# Patient Record
Sex: Female | Born: 1935 | Race: White | Hispanic: No | Smoking: Never smoker
Health system: Southern US, Community
[De-identification: ages and names within clinical notes are randomized; demographics above are authoritative.]

## PROBLEM LIST (undated history)

## (undated) DIAGNOSIS — K219 Gastro-esophageal reflux disease without esophagitis: Secondary | ICD-10-CM

## (undated) DIAGNOSIS — N200 Calculus of kidney: Secondary | ICD-10-CM

## (undated) DIAGNOSIS — I10 Essential (primary) hypertension: Secondary | ICD-10-CM

## (undated) DIAGNOSIS — I499 Cardiac arrhythmia, unspecified: Secondary | ICD-10-CM

## (undated) DIAGNOSIS — IMO0001 Reserved for inherently not codable concepts without codable children: Secondary | ICD-10-CM

## (undated) HISTORY — PX: OTHER SURGICAL HISTORY: SHX169

## (undated) HISTORY — PX: FOOT SURGERY: SHX648

## (undated) HISTORY — PX: BACK SURGERY: SHX140

## (undated) HISTORY — PX: ROTATOR CUFF REPAIR: SHX139

## (undated) HISTORY — PX: KNEE ARTHROSCOPY: SUR90

## (undated) HISTORY — PX: HAND SURGERY: SHX662

## (undated) HISTORY — PX: JOINT REPLACEMENT: SHX530

---

## 2011-09-23 ENCOUNTER — Emergency Department (HOSPITAL_BASED_OUTPATIENT_CLINIC_OR_DEPARTMENT_OTHER)
Admission: EM | Admit: 2011-09-23 | Discharge: 2011-09-23 | Disposition: A | Discharge status: Home / Self Care | Payer: Medicare Other | Attending: Emergency Medicine | Admitting: Emergency Medicine

## 2011-09-23 ENCOUNTER — Emergency Department (HOSPITAL_BASED_OUTPATIENT_CLINIC_OR_DEPARTMENT_OTHER): Payer: Medicare Other

## 2011-09-23 ENCOUNTER — Encounter (HOSPITAL_BASED_OUTPATIENT_CLINIC_OR_DEPARTMENT_OTHER): Payer: Self-pay | Admitting: *Deleted

## 2011-09-23 DIAGNOSIS — K219 Gastro-esophageal reflux disease without esophagitis: Secondary | ICD-10-CM | POA: Insufficient documentation

## 2011-09-23 DIAGNOSIS — N201 Calculus of ureter: Secondary | ICD-10-CM

## 2011-09-23 DIAGNOSIS — R109 Unspecified abdominal pain: Secondary | ICD-10-CM | POA: Insufficient documentation

## 2011-09-23 DIAGNOSIS — N1330 Unspecified hydronephrosis: Secondary | ICD-10-CM | POA: Insufficient documentation

## 2011-09-23 DIAGNOSIS — I10 Essential (primary) hypertension: Secondary | ICD-10-CM | POA: Insufficient documentation

## 2011-09-23 DIAGNOSIS — N200 Calculus of kidney: Secondary | ICD-10-CM | POA: Insufficient documentation

## 2011-09-23 DIAGNOSIS — N289 Disorder of kidney and ureter, unspecified: Secondary | ICD-10-CM

## 2011-09-23 DIAGNOSIS — Z79899 Other long term (current) drug therapy: Secondary | ICD-10-CM | POA: Insufficient documentation

## 2011-09-23 DIAGNOSIS — Z87442 Personal history of urinary calculi: Secondary | ICD-10-CM | POA: Insufficient documentation

## 2011-09-23 HISTORY — DX: Calculus of kidney: N20.0

## 2011-09-23 HISTORY — DX: Reserved for inherently not codable concepts without codable children: IMO0001

## 2011-09-23 HISTORY — DX: Essential (primary) hypertension: I10

## 2011-09-23 HISTORY — DX: Gastro-esophageal reflux disease without esophagitis: K21.9

## 2011-09-23 HISTORY — DX: Cardiac arrhythmia, unspecified: I49.9

## 2011-09-23 LAB — CBC
Hemoglobin: 13.7 g/dL (ref 12.0–15.0)
Platelets: 178 10*3/uL (ref 150–400)
RBC: 4.32 MIL/uL (ref 3.87–5.11)
WBC: 6.7 10*3/uL (ref 4.0–10.5)

## 2011-09-23 LAB — COMPREHENSIVE METABOLIC PANEL
ALT: 27 U/L (ref 0–35)
Alkaline Phosphatase: 75 U/L (ref 39–117)
BUN: 23 mg/dL (ref 6–23)
CO2: 26 mEq/L (ref 19–32)
Chloride: 99 mEq/L (ref 96–112)
GFR calc Af Amer: 36 mL/min — ABNORMAL LOW (ref >90)
GFR calc non Af Amer: 31 mL/min — ABNORMAL LOW (ref >90)
Glucose, Bld: 159 mg/dL — ABNORMAL HIGH (ref 70–99)
Potassium: 3.4 mEq/L — ABNORMAL LOW (ref 3.5–5.1)
Sodium: 136 mEq/L (ref 135–145)
Total Bilirubin: 0.9 mg/dL (ref 0.3–1.2)
Total Protein: 7.1 g/dL (ref 6.0–8.3)

## 2011-09-23 LAB — URINALYSIS, ROUTINE W REFLEX MICROSCOPIC
Bilirubin Urine: NEGATIVE
Hgb urine dipstick: NEGATIVE
Nitrite: NEGATIVE
Specific Gravity, Urine: 1.008 (ref 1.005–1.030)
Urobilinogen, UA: 0.2 mg/dL (ref 0.0–1.0)
pH: 5.5 (ref 5.0–8.0)

## 2011-09-23 LAB — DIFFERENTIAL
Lymphocytes Relative: 16 % (ref 12–46)
Lymphs Abs: 1.1 10*3/uL (ref 0.7–4.0)
Monocytes Relative: 13 % — ABNORMAL HIGH (ref 3–12)
Neutro Abs: 4.7 10*3/uL (ref 1.7–7.7)
Neutrophils Relative %: 70 % (ref 43–77)

## 2011-09-23 MED ORDER — HYDROMORPHONE HCL PF 1 MG/ML IJ SOLN
1.0000 mg | Freq: Once | INTRAMUSCULAR | Status: AC
  Administered 2011-09-23: 1 mg via INTRAVENOUS
  Filled 2011-09-23: qty 1

## 2011-09-23 MED ORDER — SODIUM CHLORIDE 0.9 % IV SOLN
Freq: Once | INTRAVENOUS | Status: AC
  Administered 2011-09-23: 09:00:00 via INTRAVENOUS

## 2011-09-23 MED ORDER — TAMSULOSIN HCL 0.4 MG PO CAPS: 0.4000 mg | ORAL_CAPSULE | Freq: Every day | ORAL | Status: DC

## 2011-09-23 MED ORDER — ONDANSETRON HCL 4 MG/2ML IJ SOLN
4.0000 mg | Freq: Once | INTRAMUSCULAR | Status: AC
  Administered 2011-09-23: 4 mg via INTRAVENOUS
  Filled 2011-09-23: qty 2

## 2011-09-23 MED ORDER — HYDROMORPHONE HCL 2 MG PO TABS: 2.0000 mg | ORAL_TABLET | ORAL | Status: AC | PRN

## 2011-09-23 MED ORDER — METOCLOPRAMIDE HCL 10 MG PO TABS: 10.0000 mg | ORAL_TABLET | Freq: Four times a day (QID) | ORAL | Status: DC

## 2011-09-23 NOTE — ED Notes (Signed)
Patient states 2 days ago she developed nausea and malaise which woke her up from sleep at 0330 am.  Symptoms were associated with dry heaves.  States she normally has up to 4 BM's per day, has only had one very small bm over the last 2 days.  Developed left flank pain several hours later, history of kidney stones.  States pain and nausea have progressively worsened.

## 2011-09-23 NOTE — Discharge Instructions (Signed)
Return to the emergency department if pain is not being adequately controlled at home, or if you start running a fever. You do have a large stone in your kidney in addition to the one in your ureter. Please followup with either your urologist, or Dr. Laverle Patter, to see if you need any treatment for that.  Ureteral Colic Ureteral colic is spasm-like pain from the kidney or the ureter. This is often caused by a kidney stone. The pain is caused by the stone trying to get through the tubes that pass your pee. HOME CARE   Drink enough fluids to keep your pee (urine) clear or pale yellow.   Strain all your pee. A strainer will be provided. Keep anything caught in the strainer and bring it to your doctor. The stone causing the pain may be very small.   Only take medicine as told by your doctor.   Follow up with your doctor as told.  GET HELP RIGHT AWAY IF:   Pain is not controlled with medicine.   Pain continues or gets worse.   The pain changes and there is chest or belly (abdominal) pain.   You pass out (faint).   You cannot pee.   You keep throwing up (vomiting).   You have a temperature by mouth above 102 F (38.9 C), not controlled by medicine.  MAKE SURE YOU:   Understand these instructions.   Will watch this condition.   Will get help right away if you are not doing well or get worse.  Document Released: 09/14/2007 Document Revised: 03/17/2011 Document Reviewed: 09/14/2007 [facility] Patient Information 2012 [place], [place].  Hydromorphone tablets What is this medicine? HYDROMORPHONE (hye droe MOR fone) is a pain reliever. It is used to treat moderate to severe pain. This medicine may be used for other purposes; ask your health care provider or pharmacist if you have questions. What should I tell my health care provider before I take this medicine? They need to know if you have any of these conditions: -brain tumor -drug abuse or addiction -head injury -heart  disease -frequently drink alcohol containing drinks -kidney disease or problems going to the bathroom -liver disease -lung disease, asthma, or breathing problems -mental problems -an allergic or unusual reaction to lactose, hydromorphone, other opioid analgesics, other medicines, sulfites, foods, dyes, or preservatives -pregnant or trying to get pregnant -breast-feeding How should I use this medicine? Take this medicine by mouth with a glass of water. If the medicine upsets your stomach, take it with food or milk. Follow the directions on the prescription label. Do not take more medicine than you are told to take. Talk to your pediatrician regarding the use of this medicine in children. Special care may be needed. Overdosage: If you think you have taken too much of this medicine contact a poison control center or emergency room at once. NOTE: This medicine is only for you. Do not share this medicine with others. What if I miss a dose? If you miss a dose, take it as soon as you can. If it is almost time for your next dose, take only that dose. Do not take double or extra doses. What may interact with this medicine? -alcohol -antihistamines for allergy, cough and cold -medicines for anesthesia -medicines for depression, anxiety, or psychotic disturbances -medicines for sleep -muscle relaxants -naltrexone -narcotic medicines for pain -phenothiazines like chlorpromazine, mesoridazine, prochlorperazine, thioridazine This list may not describe all possible interactions. Give your health care provider a list of all the medicines,  herbs, non-prescription drugs, or dietary supplements you use. Also tell them if you smoke, drink alcohol, or use illegal drugs. Some items may interact with your medicine. What should I watch for while using this medicine? Tell your doctor or health care professional if your pain does not go away, if it gets worse, or if you have new or a different type of pain. You  may develop tolerance to the medicine. Tolerance means that you will need a higher dose of the medicine for pain relief. Tolerance is normal and is expected if you take this medicine for a long time. Do not suddenly stop taking your medicine because you may develop a severe reaction. Your body becomes used to the medicine. This does NOT mean you are addicted. Addiction is a behavior related to getting and using a drug for a non-medical reason. If you have pain, you have a medical reason to take pain medicine. Your doctor will tell you how much medicine to take. If your doctor wants you to stop the medicine, the dose will be slowly lowered over time to avoid any side effects. You may get drowsy or dizzy. Do not drive, use machinery, or do anything that needs mental alertness until you know how this medicine affects you. Do not stand or sit up quickly, especially if you are an older patient. This reduces the risk of dizzy or fainting spells. Alcohol may interfere with the effect of this medicine. Avoid alcoholic drinks. This medicine will cause constipation. Try to have a bowel movement at least every 2 to 3 days. If you do not have a bowel movement for 3 days, call your doctor or health care professional. Your mouth may get dry. Chewing sugarless gum or sucking hard candy, and drinking plenty of water may help. Contact your doctor if the problem does not go away or is severe. What side effects may I notice from receiving this medicine? Side effects that you should report to your doctor or health care professional as soon as possible: -allergic reactions like skin rash, itching or hives, swelling of the face, lips, or tongue -breathing problems -changes in vision -confusion -feeling faint or lightheaded, falls -seizures -slow or fast heartbeat -trouble passing urine or change in the amount of urine -trouble with balance, talking, walking Side effects that usually do not require medical attention  (report to your doctor or health care professional if they continue or are bothersome): -difficulty sleeping -drowsiness -dry mouth -flushing -headache -itching -loss of appetite -nausea, vomiting This list may not describe all possible side effects. Call your doctor for medical advice about side effects. You may report side effects to FDA at 1-800-FDA-1088. Where should I keep my medicine? Keep out of the reach of children. This medicine can be abused. Keep your medicine in a safe place to protect it from theft. Do not share this medicine with anyone. Selling or giving away this medicine is dangerous and against the law. Store at room temperature between 15 and 30 degrees C (59 and 86 degrees F). Keep container tightly closed. Protect from light. Flush any unused medicines down the toilet. Do not use the medicine after the expiration date. NOTE: This sheet is a summary. It may not cover all possible information. If you have questions about this medicine, talk to your doctor, pharmacist, or health care provider.  2012, [employer]. (02/22/2008 10:24:26 AM)  Tamsulosin capsules What is this medicine? TAMSULOSIN (tam SOO loe sin) is used to treat enlargement of the prostate  gland in men, a condition called benign prostatic hyperplasia or BPH. It is not for use in women. It works by relaxing muscles in the prostate and bladder neck. This improves urine flow and reduces BPH symptoms. This medicine may be used for other purposes; ask your health care provider or pharmacist if you have questions. What should I tell my health care provider before I take this medicine? They need to know if you have any of the following conditions: -advanced kidney disease -advanced liver disease -low blood pressure -prostate cancer -an unusual or allergic reaction to tamsulosin, sulfa drugs, other medicines, foods, dyes, or preservatives -pregnant or trying to get pregnant -breast-feeding How should  I use this medicine? Take this medicine by mouth about 30 minutes after the same meal every day. Follow the directions on the prescription label. Swallow the capsules whole with a glass of water. Do not crush, chew, or open capsules. Do not take your medicine more often than directed. Do not stop taking your medicine unless your doctor tells you to. Talk to your pediatrician regarding the use of this medicine in children. Special care may be needed. Overdosage: If you think you have taken too much of this medicine contact a poison control center or emergency room at once. NOTE: This medicine is only for you. Do not share this medicine with others. What if I miss a dose? If you miss a dose, take it as soon as you can. If it is almost time for your next dose, take only that dose. Do not take double or extra doses. If you stop taking your medicine for several days or more, ask your doctor or health care professional what dose you should start back on. What may interact with this medicine? -cimetidine -fluoxetine -ketoconazole -medicines for erectile disfunction like sildenafil, tadalafil, vardenafil -medicines for high blood pressure -other alpha-blockers like alfuzosin, doxazosin, phentolamine, phenoxybenzamine, prazosin, terazosin -warfarin This list may not describe all possible interactions. Give your health care provider a list of all the medicines, herbs, non-prescription drugs, or dietary supplements you use. Also tell them if you smoke, drink alcohol, or use illegal drugs. Some items may interact with your medicine. What should I watch for while using this medicine? Visit your doctor or health care professional for regular check ups. You will need lab work done before you start this medicine and regularly while you are taking it. Check your blood pressure as directed. Ask your health care professional what your blood pressure should be, and when you should contact him or her. This medicine may  make you feel dizzy or lightheaded. This is more likely to happen after the first dose, after an increase in dose, or during hot weather or exercise. Drinking alcohol and taking some medicines can make this worse. Do not drive, use machinery, or do anything that needs mental alertness until you know how this medicine affects you. Do not sit or stand up quickly. If you begin to feel dizzy, sit down until you feel better. These effects can decrease once your body adjusts to the medicine. Although extremely rare in men taking this medicine, contact you doctor immediately if you have a prolonged and painful erection of the penis which is unrelated to sexual activity. If you do not get medical attention, this condition can lead to permanent erectile dysfunction. If you are thinking of having cataract surgery, tell your eye surgeon that you have taken this medicine. What side effects may I notice from receiving this medicine? Side  effects that you should report to your doctor or health care professional as soon as possible: -allergic reactions like skin rash or itching, hives, swelling of the lips, mouth, tongue, or throat -breathing problems -change in vision -feeling faint or lightheaded -irregular heartbeat -weakness Side effects that usually do not require medical attention (report to your doctor or health care professional if they continue or are bothersome): -back pain -change in sex drive or performance -constipation, nausea or vomiting -cough -drowsy -runny or stuffy nose -trouble sleeping This list may not describe all possible side effects. Call your doctor for medical advice about side effects. You may report side effects to FDA at 1-800-FDA-1088. Where should I keep my medicine? Keep out of the reach of children. Store at room temperature between 15 and 30 degrees C (59 and 86 degrees F). Throw away any unused medicine after the expiration date. NOTE: This sheet is a summary. It may not  cover all possible information. If you have questions about this medicine, talk to your doctor, pharmacist, or health care provider.  2012, [employer]. (06/04/2007 6:08:27 PM)  Metoclopramide tablets What is this medicine? METOCLOPRAMIDE (met oh kloe PRA mide) is used to treat the symptoms of gastroesophageal reflux disease (GERD) like heartburn. It is also used to treat people with slow emptying of the stomach and intestinal tract. This medicine may be used for other purposes; ask your health care provider or pharmacist if you have questions. What should I tell my health care provider before I take this medicine? They need to know if you have any of these conditions: -breast cancer -depression -diabetes -heart failure -high blood pressure -kidney disease -liver disease -Parkinson's disease or a movement disorder -pheochromocytoma -seizures -stomach obstruction, bleeding, or perforation -an unusual or allergic reaction to metoclopramide, procainamide, sulfites, other medicines, foods, dyes, or preservatives -pregnant or trying to get pregnant -breast-feeding How should I use this medicine? Take this medicine by mouth with a glass of water. Follow the directions on the prescription label. Take this medicine on an empty stomach, about 30 minutes before eating. Take your doses at regular intervals. Do not take your medicine more often than directed. Do not stop taking except on the advice of your doctor or health care professional. A special MedGuide will be given to you by the pharmacist with each prescription and refill. Be sure to read this information carefully each time. Talk to your pediatrician regarding the use of this medicine in children. Special care may be needed. Overdosage: If you think you have taken too much of this medicine contact a poison control center or emergency room at once. NOTE: This medicine is only for you. Do not share this medicine with others. What  if I miss a dose? If you miss a dose, take it as soon as you can. If it is almost time for your next dose, take only that dose. Do not take double or extra doses. What may interact with this medicine? -acetaminophen -cyclosporine -digoxin -medicines for blood pressure -medicines for diabetes, including insulin -medicines for hay fever and other allergies -medicines for depression, especially an Monoamine Oxidase Inhibitor (MAOI) -medicines for Parkinson's disease, like levodopa -medicines for sleep or for pain -tetracycline This list may not describe all possible interactions. Give your health care provider a list of all the medicines, herbs, non-prescription drugs, or dietary supplements you use. Also tell them if you smoke, drink alcohol, or use illegal drugs. Some items may interact with your medicine. What should I watch  for while using this medicine? It may take a few weeks for your stomach condition to start to get better. However, do not take this medicine for longer than 12 weeks. The longer you take this medicine, and the more you take it, the greater your chances are of developing serious side effects. If you are an elderly patient, a female patient, or you have diabetes, you may be at an increased risk for side effects from this medicine. Contact your doctor immediately if you start having movements you cannot control such as lip smacking, rapid movements of the tongue, involuntary or uncontrollable movements of the eyes, head, arms and legs, or muscle twitches and spasms. Patients and their families should watch out for worsening depression or thoughts of suicide. Also watch out for any sudden or severe changes in feelings such as feeling anxious, agitated, panicky, irritable, hostile, aggressive, impulsive, severely restless, overly excited and hyperactive, or not being able to sleep. If this happens, especially at the beginning of treatment or after a change in dose, call your  doctor. Do not treat yourself for high fever. Ask your doctor or health care professional for advice. You may get drowsy or dizzy. Do not drive, use machinery, or do anything that needs mental alertness until you know how this drug affects you. Do not stand or sit up quickly, especially if you are an older patient. This reduces the risk of dizzy or fainting spells. Alcohol can make you more drowsy and dizzy. Avoid alcoholic drinks. What side effects may I notice from receiving this medicine? Side effects that you should report to your doctor or health care professional as soon as possible: -allergic reactions like skin rash, itching or hives, swelling of the face, lips, or tongue -abnormal production of milk in females -breast enlargement in both males and females -change in the way you walk -difficulty moving, speaking or swallowing -drooling, lip smacking, or rapid movements of the tongue -excessive sweating -fever -involuntary or uncontrollable movements of the eyes, head, arms and legs -irregular heartbeat or palpitations -muscle twitches and spasms -unusually weak or tired Side effects that usually do not require medical attention (report to your doctor or health care professional if they continue or are bothersome): -change in sex drive or performance -depressed mood -diarrhea -difficulty sleeping -headache -menstrual changes -restless or nervous This list may not describe all possible side effects. Call your doctor for medical advice about side effects. You may report side effects to FDA at 1-800-FDA-1088. Where should I keep my medicine? Keep out of the reach of children. Store at room temperature between 20 and 25 degrees C (68 and 77 degrees F). Protect from light. Keep container tightly closed. Throw away any unused medicine after the expiration date. NOTE: This sheet is a summary. It may not cover all possible information. If you have questions about this medicine, talk to  your doctor, pharmacist, or health care provider.  2012, [employer]. (11/21/2007 4:30:05 PM)

## 2011-09-23 NOTE — ED Provider Notes (Signed)
History     CSN: [id number]  Arrival date & time 09/23/11  0800   First MD Initiated Contact with Patient 09/23/11 [phone]      Chief Complaint  Patient presents with  . Abdominal Pain    (Consider location/radiation/quality/duration/timing/severity/associated sxs/prior treatment) HPI 77 year old female has had left flank pain for the last 2 days. Pain radiates to the left lower abdomen. It is a dull, achy pain which has been getting more severe. She currently rates the pain at 8/10. Nothing makes it better but it is worse when she lays on her back. There is been associated nausea and dry heaves but no vomiting. She also is noted that she feels bloated but is unable to pass flatus. She is also had a virtually no bowel movements during this time instead of her usual 3, but today. She denies history of previous abdominal surgery. She does have a history of kidney stones with lithotripsy in 2001. Her symptoms started with nausea and dry heaves and the flank pain followed up. She states that she thought she initially had food poisoning. She did feel hot all over but does not know if she ran a fever. Past Medical History  Diagnosis Date  . Kidney stone   . Hypertension   . Irregular heart beat   . Reflux     Past Surgical History  Procedure Date  . Back surgery   . Joint replacement   . Foot surgery     bilateral feet , 3 surgeries each  . Rotator cuff repair     left  . Knee arthroscopy   . Hand surgery     left    No family history on file.  History  Substance Use Topics  . Smoking status: Never Smoker   . Smokeless tobacco: Not on file  . Alcohol Use: No    OB History    Grav Para Term Preterm Abortions TAB SAB Ect Mult Living                  Review of Systems  Allergies  Tramadol; Aspirin; Codeine; Erythromycin; Flexeril; and Stinging nettle  Home Medications   Current Outpatient Rx  Name Route Sig Dispense Refill  . VITAMIN D 1000 UNITS PO TABS Oral Take  2,000 Units by mouth daily.    . OMEGA-3 FATTY ACIDS 1000 MG PO CAPS Oral Take 2 g by mouth daily.    Marland Kitchen FLAXSEED OIL PO Oral Take 1,000 mg by mouth.    Marland Kitchen GLUCOSAMINE-CHONDROITIN 500-400 MG PO TABS Oral Take 1 tablet by mouth 3 (three) times daily.    Marland Kitchen GREEN TEA 315 MG PO CAPS Oral Take by mouth 2 (two) times daily.    . MELOXICAM 15 MG PO TABS Oral Take 15 mg by mouth daily.    Marland Kitchen METOPROLOL SUCCINATE ER 25 MG PO TB24 Oral Take 25 mg by mouth daily.    . MULTIVITAMINS PO CAPS Oral Take 1 capsule by mouth daily.    Marland Kitchen OMEPRAZOLE 20 MG PO CPDR Oral Take 20 mg by mouth daily.      BP 181/67  Pulse 89  Temp 98.7 F (37.1 C) (Oral)  Resp 18  Ht 5\' 8"  (1.727 m)  Wt 260 lb (117.935 kg)  BMI 39.53 kg/m2  SpO2 100%  Physical Exam 77 year old female is obese and in no acute distress. Vital signs are significant for moderate systolic hypertension with blood pressure 181/67. Oxygen saturation is 100% which is normal. Head is  normocephalic and atraumatic. PERRLA, EOMI. There is no scleral icterus. Oropharynx clear. Neck is nontender and supple. Back has no midline tenderness but there is mild left CVA tenderness. Lungs are clear without rales, wheezes, or rhonchi. Heart has regular rate and rhythm without murmur. Abdomen is soft, flat, nontender without masses or hepatosplenomegaly. Peristalsis is decreased. Extremities have no cyanosis or edema, full range of motion is present. Skin is warm and dry without rash. Neurologic: Mental status is normal, cranial nerves are intact, there are no motor or sensory deficits. ED Course  Procedures (including critical care time)  Results for orders placed during the hospital encounter of 09/23/11  URINALYSIS, ROUTINE W REFLEX MICROSCOPIC      Component Value Range   Color, Urine YELLOW  YELLOW   APPearance CLEAR  CLEAR   Specific Gravity, Urine 1.008  1.005 - 1.030   pH 5.5  5.0 - 8.0   Glucose, UA NEGATIVE  NEGATIVE mg/dL   Hgb urine dipstick NEGATIVE   NEGATIVE   Bilirubin Urine NEGATIVE  NEGATIVE   Ketones, ur NEGATIVE  NEGATIVE mg/dL   Protein, ur NEGATIVE  NEGATIVE mg/dL   Urobilinogen, UA 0.2  0.0 - 1.0 mg/dL   Nitrite NEGATIVE  NEGATIVE   Leukocytes, UA NEGATIVE  NEGATIVE  CBC      Component Value Range   WBC 6.7  4.0 - 10.5 K/uL   RBC 4.32  3.87 - 5.11 MIL/uL   Hemoglobin 13.7  12.0 - 15.0 g/dL   HCT 78.4  69.6 - 29.5 %   MCV 93.1  78.0 - 100.0 fL   MCH 31.7  26.0 - 34.0 pg   MCHC 34.1  30.0 - 36.0 g/dL   RDW 28.4  13.2 - 44.0 %   Platelets 178  150 - 400 K/uL  DIFFERENTIAL      Component Value Range   Neutrophils Relative 70  43 - 77 %   Neutro Abs 4.7  1.7 - 7.7 K/uL   Lymphocytes Relative 16  12 - 46 %   Lymphs Abs 1.1  0.7 - 4.0 K/uL   Monocytes Relative 13 (*) 3 - 12 %   Monocytes Absolute 0.9  0.1 - 1.0 K/uL   Eosinophils Relative 1  0 - 5 %   Eosinophils Absolute 0.0  0.0 - 0.7 K/uL   Basophils Relative 0  0 - 1 %   Basophils Absolute 0.0  0.0 - 0.1 K/uL  COMPREHENSIVE METABOLIC PANEL      Component Value Range   Sodium 136  135 - 145 mEq/L   Potassium 3.4 (*) 3.5 - 5.1 mEq/L   Chloride 99  96 - 112 mEq/L   CO2 26  19 - 32 mEq/L   Glucose, Bld 159 (*) 70 - 99 mg/dL   BUN 23  6 - 23 mg/dL   Creatinine, Ser 1.02 (*) 0.50 - 1.10 mg/dL   Calcium 9.4  8.4 - 72.5 mg/dL   Total Protein 7.1  6.0 - 8.3 g/dL   Albumin 3.5  3.5 - 5.2 g/dL   AST 21  0 - 37 U/L   ALT 27  0 - 35 U/L   Alkaline Phosphatase 75  39 - 117 U/L   Total Bilirubin 0.9  0.3 - 1.2 mg/dL   GFR calc non Af Amer 31 (*) >90 mL/min   GFR calc Af Amer 36 (*) >90 mL/min  LIPASE, BLOOD      Component Value Range   Lipase 24  11 - 59 U/L   Ct Abdomen Pelvis Wo Contrast  09/23/2011  *RADIOLOGY REPORT*  Clinical Data: Left flank pain, nausea  CT ABDOMEN AND PELVIS WITHOUT CONTRAST  Technique:  Multidetector CT imaging of the abdomen and pelvis was performed following the standard protocol without intravenous contrast.  Comparison: None  Findings:  Lung bases are unremarkable.  The sagittal images of the spine shows multilevel degenerative changes lumbar spine. Multilevel disc space flattening with vacuum disc phenomenon noted.  Unenhanced liver is unremarkable.  No calcified gallstones are noted within gallbladder.  The unenhanced pancreas, spleen and right adrenal gland is unremarkable.  There is low density nodule left adrenal gland measures 1.6 cm probable adenoma.  There is mild left hydronephrosis and left hydroureter.  Mild left perinephric stranding is noted.  Nonobstructive calcified calculus in lower pole of the left kidney measures 1.6 x 1 cm. Nonobstructive calcified calculus mid pole of the left kidney measures 3 mm.  Second nonobstructive calculus mid pole of the left kidney measures 2.2 mm.  Mild left periureteral stranding.  No aortic aneurysm.  No small bowel obstruction.  No ascites or free air.  No adenopathy.  Axial image 77 there is a nonobstructive calcified calculus in distal left ureter measures 1.4 mm about 2.2 cm from the left UVJ. In axial image 84 there is a calcified obstructive calculus in the left UVJ/urinary bladder wall measures 6.3 mm.  The urinary bladder is under distended.  The distal right ureter is unremarkable.  Multiple sigmoid colon diverticula are noted without evidence of acute diverticulitis.  The unenhanced uterus is atrophic.  There is no pericecal inflammation.  The terminal ileum is unremarkable.  IMPRESSION:  1.  There is  left nonobstructive nephrolithiasis. 2.  Mild left hydronephrosis and left hydroureter.  Mild left perinephric and periureteral stranding. 3.  There is a 1.4 mm nonobstructive calcified calculus in distal left ureter about 2.2 cm from left UVJ.  There is 6 mm calcified obstructive calculus in the left UVJ/urinary bladder wall.  4.  Degenerative changes lumbar spine.  Original Report Authenticated By: Natasha Mead, M.D.      1. Ureterolithiasis   2. Renal insufficiency       MDM    Flank pain which is most likely ureteral colic. However at differential diagnosis is quite broad. She has no prior surgeries so small bowel obstruction is unlikely. She needs to be considered for possibility of diverticulitis and pyelonephritis and abdominal aneurysm. CT scan has been ordered. She has no prior records in [place].   CT shows a 6 mm distal left ureteral calculus with hydronephrosis which accounts for her symptoms. Mild renal insufficiency is noted. She got good relief of pain with intravenous hydromorphone and did not have a reaction to it. She will be sent home with prescriptions for hydromorphone, tamsulosin, and metoclopramide. She is already on an NSAID, so she does not need a prescription for one. She is referred to urology for followup. She is advised to return to the emergency department if pain is not being controlled or she develops a fever.      Dione Booze, MD 09/23/11 1059

## 2014-11-26 ENCOUNTER — Emergency Department (HOSPITAL_BASED_OUTPATIENT_CLINIC_OR_DEPARTMENT_OTHER)
Admission: EM | Admit: 2014-11-26 | Discharge: 2014-11-26 | Disposition: A | Discharge status: Home / Self Care | Payer: Medicare HMO | Attending: Emergency Medicine | Admitting: Emergency Medicine

## 2014-11-26 ENCOUNTER — Emergency Department (HOSPITAL_BASED_OUTPATIENT_CLINIC_OR_DEPARTMENT_OTHER): Payer: Medicare HMO

## 2014-11-26 ENCOUNTER — Encounter (HOSPITAL_BASED_OUTPATIENT_CLINIC_OR_DEPARTMENT_OTHER): Payer: Self-pay

## 2014-11-26 DIAGNOSIS — L03116 Cellulitis of left lower limb: Secondary | ICD-10-CM

## 2014-11-26 DIAGNOSIS — I10 Essential (primary) hypertension: Secondary | ICD-10-CM | POA: Diagnosis not present

## 2014-11-26 DIAGNOSIS — Z79899 Other long term (current) drug therapy: Secondary | ICD-10-CM | POA: Diagnosis not present

## 2014-11-26 DIAGNOSIS — Z87442 Personal history of urinary calculi: Secondary | ICD-10-CM | POA: Insufficient documentation

## 2014-11-26 DIAGNOSIS — M79605 Pain in left leg: Secondary | ICD-10-CM | POA: Diagnosis present

## 2014-11-26 DIAGNOSIS — Z791 Long term (current) use of non-steroidal anti-inflammatories (NSAID): Secondary | ICD-10-CM | POA: Diagnosis not present

## 2014-11-26 DIAGNOSIS — K219 Gastro-esophageal reflux disease without esophagitis: Secondary | ICD-10-CM | POA: Diagnosis not present

## 2014-11-26 LAB — COMPREHENSIVE METABOLIC PANEL
ALBUMIN: 3.6 g/dL (ref 3.5–5.0)
ALK PHOS: 60 U/L (ref 38–126)
ALT: 15 U/L (ref 14–54)
ANION GAP: 10 (ref 5–15)
AST: 19 U/L (ref 15–41)
BILIRUBIN TOTAL: 0.5 mg/dL (ref 0.3–1.2)
BUN: 32 mg/dL — ABNORMAL HIGH (ref 6–20)
CO2: 23 mmol/L (ref 22–32)
Calcium: 9 mg/dL (ref 8.9–10.3)
Chloride: 106 mmol/L (ref 101–111)
Creatinine, Ser: 1.33 mg/dL — ABNORMAL HIGH (ref 0.44–1.00)
GFR calc Af Amer: 43 mL/min — ABNORMAL LOW (ref >60)
GFR, EST NON AFRICAN AMERICAN: 37 mL/min — AB (ref >60)
Glucose, Bld: 133 mg/dL — ABNORMAL HIGH (ref 65–99)
POTASSIUM: 4 mmol/L (ref 3.5–5.1)
SODIUM: 139 mmol/L (ref 135–145)
TOTAL PROTEIN: 6.9 g/dL (ref 6.5–8.1)

## 2014-11-26 LAB — CBC WITH DIFFERENTIAL/PLATELET
BASOS ABS: 0 10*3/uL (ref 0.0–0.1)
Basophils Relative: 0 % (ref 0–1)
Eosinophils Absolute: 0.1 10*3/uL (ref 0.0–0.7)
Eosinophils Relative: 2 % (ref 0–5)
HEMATOCRIT: 43.1 % (ref 36.0–46.0)
Hemoglobin: 13.9 g/dL (ref 12.0–15.0)
LYMPHS PCT: 23 % (ref 12–46)
Lymphs Abs: 0.9 10*3/uL (ref 0.7–4.0)
MCH: 31.2 pg (ref 26.0–34.0)
MCHC: 32.3 g/dL (ref 30.0–36.0)
MCV: 96.9 fL (ref 78.0–100.0)
Monocytes Absolute: 0.5 10*3/uL (ref 0.1–1.0)
Monocytes Relative: 12 % (ref 3–12)
NEUTROS ABS: 2.4 10*3/uL (ref 1.7–7.7)
NEUTROS PCT: 63 % (ref 43–77)
PLATELETS: 179 10*3/uL (ref 150–400)
RBC: 4.45 MIL/uL (ref 3.87–5.11)
RDW: 14.2 % (ref 11.5–15.5)
WBC: 3.8 10*3/uL — AB (ref 4.0–10.5)

## 2014-11-26 MED ORDER — CEPHALEXIN 250 MG PO CAPS: 250.0000 mg | ORAL_CAPSULE | Freq: Two times a day (BID) | ORAL | Status: DC

## 2014-11-26 NOTE — ED Provider Notes (Signed)
CSN: [id number]     Arrival date & time 11/26/14  1813 History   This chart was scribed for Heather Core, MD by Arlan Organ, ED Scribe. This patient was seen in room MH01/MH01 and the patient's care was started 6:27 PM.   Chief Complaint  Patient presents with  . Leg Pain   The history is provided by the patient. No language interpreter was used.    HPI Comments: Heather Pitts is a 80 y.o. female with a PMHx of HTN, kidney stones, and irregular heart beat who presents to the Emergency Department complaining of constant, ongoing L lower extremity pain with associated redness onset this morning. Pt states her legs are typically tender to touch at baseline, however, current discomfort feels different. No recent injury or trauma. Pt reports a previous episode a few years ago which resolved without treatment. No recent fever, chills, chest pain, or shortness of breath. Pt states she recently travelled to [place] for approximately 5 hours rounds trip in a car. No personal history of cancer. She is not a smoker. No person al or family history of blood clots. She is on Septra currently for a possible infection which was recently started yesterday. PSHx includes L knee surgery-2001.  Past Medical History  Diagnosis Date  . Kidney stone   . Hypertension   . Irregular heart beat   . Reflux    Past Surgical History  Procedure Laterality Date  . Back surgery    . Joint replacement    . Foot surgery      bilateral feet , 3 surgeries each  . Rotator cuff repair      left  . Knee arthroscopy    . Hand surgery      left   No family history on file. Social History  Substance Use Topics  . Smoking status: Never Smoker   . Smokeless tobacco: None  . Alcohol Use: No   OB History    No data available     Review of Systems  Constitutional: Negative for fever and chills.  Respiratory: Negative for cough and shortness of breath.   Cardiovascular: Negative for chest pain.   Gastrointestinal: Negative for vomiting, abdominal pain and diarrhea.  Musculoskeletal: Positive for arthralgias. Negative for joint swelling.  Skin: Positive for color change.  Neurological: Negative for weakness and numbness.  Psychiatric/Behavioral: Negative for confusion.      Allergies  Tramadol; Aspirin; Codeine; Erythromycin; Flexeril; and Stinging nettle  Home Medications   Prior to Admission medications   Medication Sig Start Date End Date Taking? Authorizing Provider  cephALEXin (KEFLEX) 250 MG capsule Take 1 capsule (250 mg total) by mouth 2 (two) times daily. 11/26/14   Heather Core, MD  cholecalciferol (VITAMIN D) 1000 UNITS tablet Take 2,000 Units by mouth daily.    Historical Provider, MD  fish oil-omega-3 fatty acids 1000 MG capsule Take 2 g by mouth daily.    Historical Provider, MD  Flaxseed, Linseed, (FLAXSEED OIL PO) Take 1,000 mg by mouth.    Historical Provider, MD  glucosamine-chondroitin 500-400 MG tablet Take 1 tablet by mouth 3 (three) times daily.    Historical Provider, MD  Chilton Si Tea 315 MG CAPS Take by mouth 2 (two) times daily.    Historical Provider, MD  meloxicam (MOBIC) 15 MG tablet Take 15 mg by mouth daily.    Historical Provider, MD  metoprolol succinate (TOPROL-XL) 25 MG 24 hr tablet Take 25 mg by mouth daily.    Historical Provider,  MD  Multiple Vitamin (MULTIVITAMIN) capsule Take 1 capsule by mouth daily.    Historical Provider, MD  omeprazole (PRILOSEC) 20 MG capsule Take 20 mg by mouth daily.    Historical Provider, MD   Triage Vitals: BP 148/51 mmHg  Pulse 70  Temp(Src) 98.7 F (37.1 C) (Oral)  Resp 18  Ht  (1.702 m)  Wt 223 lb (101.152 kg)  BMI 34.92 kg/m2  SpO2 98%   Physical Exam  Constitutional: She is oriented to person, place, and time. She appears well-developed and well-nourished. No distress.  HENT:  Head: Normocephalic and atraumatic.  Eyes: EOM are normal.  Neck: Normal range of motion.  Cardiovascular: Normal  rate, regular rhythm and normal heart sounds.   Pulmonary/Chest: Effort normal and breath sounds normal.  Abdominal: Soft. She exhibits no distension. There is no tenderness.  Musculoskeletal: Normal range of motion.  Neurological: She is alert and oriented to person, place, and time.  Skin: Skin is warm and dry.  Erythema to L lower leg medially Mild wamth and and swelling noted to L lower leg Area of hyper pigmentation towards the center of area of erythema  No irritatbility Mild petechiae near rash  Psychiatric: She has a normal mood and affect. Judgment normal.  Nursing note and vitals reviewed.   ED Course  Procedures (including critical care time)  DIAGNOSTIC STUDIES: Oxygen Saturation is 96% on RA, adequate by my interpretation.    COORDINATION OF CARE: 6:32 PM- Will order US venous img lower unilateral L, CBC, and CMP. Discussed treatment plan with pt at bedside and pt agreed to plan.     Labs Review Labs Reviewed  COMPREHENSIVE METABOLIC PANEL - Abnormal; Notable for the following:    Glucose, Bld 133 (*)    BUN 32 (*)    Creatinine, Ser 1.33 (*)    GFR calc non Af Amer 37 (*)    GFR calc Af Amer 43 (*)    All other components within normal limits  CBC WITH DIFFERENTIAL/PLATELET - Abnormal; Notable for the following:    WBC 3.8 (*)    All other components within normal limits    Imaging Review US Venous Img Lower Unilateral Left  11/26/2014   CLINICAL DATA:  Acute onset of erythema and warmth about the medial left ankle. Initial encounter.  EXAM: LEFT LOWER EXTREMITY VENOUS DOPPLER ULTRASOUND  TECHNIQUE: Gray-scale sonography with graded compression, as well as color Doppler and duplex ultrasound were performed to evaluate the lower extremity deep venous systems from the level of the common femoral vein and including the common femoral, femoral, profunda femoral, popliteal and calf veins including the posterior tibial, peroneal and gastrocnemius veins when visible.  The superficial great saphenous vein was also interrogated. Spectral Doppler was utilized to evaluate flow at rest and with distal augmentation maneuvers in the common femoral, femoral and popliteal veins.  COMPARISON:  None.  FINDINGS: Contralateral Common Femoral Vein: Respiratory phasicity is normal and symmetric with the symptomatic side. No evidence of thrombus. Normal compressibility.  Common Femoral Vein: No evidence of thrombus. Normal compressibility, respiratory phasicity and response to augmentation.  Saphenofemoral Junction: No evidence of thrombus. Normal compressibility and flow on color Doppler imaging.  Profunda Femoral Vein: No evidence of thrombus. Normal compressibility and flow on color Doppler imaging.  Femoral Vein: No evidence of thrombus. Normal compressibility, respiratory phasicity and response to augmentation.  Popliteal Vein: No evidence of thrombus. Normal compressibility, respiratory phasicity and response to augmentation.  Calf Veins: No evidence  of thrombus. Normal compressibility and flow on color Doppler imaging.  Superficial Great Saphenous Vein: No evidence of thrombus. Normal compressibility and flow on color Doppler imaging.  Venous Reflux:  None.  Other Findings:  None.  IMPRESSION: No evidence of deep venous thrombosis.   Electronically Signed   By: Roanna Raider M.D.   On: 11/26/2014 19:27   I have personally reviewed and evaluated these images and lab results as part of my medical decision-making.   EKG Interpretation None      MDM   Final diagnoses:  Cellulitis of left lower leg    Patient with erythema swelling of left lower leg. Negative Doppler. Will treat as a cellulitis. I personally performed the services described in this documentation, which was scribed in my presence. The recorded information has been reviewed and is accurate.    Heather Core, MD 11/26/14 [phone]

## 2014-11-26 NOTE — Discharge Instructions (Signed)

## 2014-11-26 NOTE — ED Notes (Signed)
Left LE pain, redness x today-recent car travel 5 hr round trip

## 2014-11-26 NOTE — ED Notes (Signed)
Pt stated that she was having some restless leg syndrome last night and was constantly rubbing her lower legs back and forth together all night

## 2014-11-26 NOTE — ED Notes (Signed)
Patient states that she noticed "heat" and redness in the LLE this morning. No swelling noted

## 2016-07-13 ENCOUNTER — Emergency Department (HOSPITAL_BASED_OUTPATIENT_CLINIC_OR_DEPARTMENT_OTHER)
Admission: EM | Admit: 2016-07-13 | Discharge: 2016-07-14 | Disposition: A | Discharge status: Home / Self Care | Payer: Medicare HMO | Attending: Emergency Medicine | Admitting: Emergency Medicine

## 2016-07-13 ENCOUNTER — Encounter (HOSPITAL_BASED_OUTPATIENT_CLINIC_OR_DEPARTMENT_OTHER): Payer: Self-pay | Admitting: Emergency Medicine

## 2016-07-13 DIAGNOSIS — L03114 Cellulitis of left upper limb: Secondary | ICD-10-CM | POA: Diagnosis not present

## 2016-07-13 DIAGNOSIS — Z79899 Other long term (current) drug therapy: Secondary | ICD-10-CM | POA: Insufficient documentation

## 2016-07-13 DIAGNOSIS — I10 Essential (primary) hypertension: Secondary | ICD-10-CM | POA: Diagnosis not present

## 2016-07-13 DIAGNOSIS — T7840XA Allergy, unspecified, initial encounter: Secondary | ICD-10-CM | POA: Diagnosis not present

## 2016-07-13 LAB — CBC WITH DIFFERENTIAL/PLATELET
Basophils Absolute: 0 10*3/uL (ref 0.0–0.1)
Basophils Relative: 0 %
Eosinophils Absolute: 0 10*3/uL (ref 0.0–0.7)
Eosinophils Relative: 1 %
HEMATOCRIT: 38.7 % (ref 36.0–46.0)
Hemoglobin: 13 g/dL (ref 12.0–15.0)
LYMPHS PCT: 24 %
Lymphs Abs: 1.2 10*3/uL (ref 0.7–4.0)
MCH: 31.9 pg (ref 26.0–34.0)
MCHC: 33.6 g/dL (ref 30.0–36.0)
MCV: 94.9 fL (ref 78.0–100.0)
Monocytes Absolute: 0.6 10*3/uL (ref 0.1–1.0)
Monocytes Relative: 13 %
NEUTROS ABS: 3.1 10*3/uL (ref 1.7–7.7)
NEUTROS PCT: 62 %
PLATELETS: 190 10*3/uL (ref 150–400)
RBC: 4.08 MIL/uL (ref 3.87–5.11)
RDW: 14.8 % (ref 11.5–15.5)
WBC: 4.9 10*3/uL (ref 4.0–10.5)

## 2016-07-13 LAB — BASIC METABOLIC PANEL
Anion gap: 7 (ref 5–15)
BUN: 14 mg/dL (ref 6–20)
CO2: 28 mmol/L (ref 22–32)
Calcium: 9.2 mg/dL (ref 8.9–10.3)
Chloride: 99 mmol/L — ABNORMAL LOW (ref 101–111)
Creatinine, Ser: 0.79 mg/dL (ref 0.44–1.00)
GFR calc Af Amer: >60 mL/min (ref >60)
GLUCOSE: 107 mg/dL — AB (ref 65–99)
POTASSIUM: 3.7 mmol/L (ref 3.5–5.1)
Sodium: 134 mmol/L — ABNORMAL LOW (ref 135–145)

## 2016-07-13 MED ORDER — DIPHENHYDRAMINE HCL 25 MG PO CAPS
25.0000 mg | ORAL_CAPSULE | Freq: Once | ORAL | Status: AC
  Administered 2016-07-13: 25 mg via ORAL
  Filled 2016-07-13: qty 1

## 2016-07-13 MED ORDER — FAMOTIDINE 20 MG PO TABS
20.0000 mg | ORAL_TABLET | Freq: Once | ORAL | Status: AC
  Administered 2016-07-13: 20 mg via ORAL
  Filled 2016-07-13: qty 1

## 2016-07-13 MED ORDER — DIPHENHYDRAMINE HCL 25 MG PO CAPS: 25.0000 mg | ORAL_CAPSULE | Freq: Four times a day (QID) | ORAL | 0 refills | Status: DC | PRN

## 2016-07-13 MED ORDER — PREDNISONE 20 MG PO TABS: ORAL_TABLET | ORAL | 0 refills | Status: DC

## 2016-07-13 MED ORDER — METHYLPREDNISOLONE SODIUM SUCC 125 MG IJ SOLR
125.0000 mg | Freq: Once | INTRAMUSCULAR | Status: AC
  Administered 2016-07-13: 125 mg via INTRAMUSCULAR
  Filled 2016-07-13: qty 2

## 2016-07-13 NOTE — Discharge Instructions (Addendum)
If you develop worsening redness, fevers or any new worsening symptoms you need to be reevaluated. Follow-up with your primary physician for recheck.

## 2016-07-13 NOTE — ED Notes (Signed)
Pt had PNA shot yesterday  Last pm had slight left upper arm swelling and redness, ,  Getting worse throughout day

## 2016-07-13 NOTE — ED Triage Notes (Signed)
Pt had PNA booster yesterday and started having swelling this evening in left arm

## 2016-07-13 NOTE — ED Provider Notes (Signed)
[facility] Provider Note   CSN: [id number] Arrival date & time: 07/13/16  2126  By signing my name below, I, Bing Neighbors., attest that this documentation has been prepared under the direction and in the presence of No att. providers found. Electronically signed: Bing Neighbors., ED Scribe. 07/14/16. 10:29 PM.   History   Chief Complaint Chief Complaint  Patient presents with  . Reaction to medication    HPI Heather Pitts is a 82 y.o. female who presents to the Emergency Department complaining of Left arm redness and pain x1 day. Pt states that she received a pneumonia booster yesterday in her left upper arm at her PCP office. Developed aching to the left upper arm yesterday evening. States she had increased swelling and redness overnight. Has been taking Tylenol with some improvement of her symptoms. Denies fever or chills. No diaphoresis. No shortness of breath or wheezing. Denies any other rashes. HPI  Past Medical History:  Diagnosis Date  . Hypertension   . Irregular heart beat   . Kidney stone   . Reflux     There are no active problems to display for this patient.   Past Surgical History:  Procedure Laterality Date  . BACK SURGERY    . FOOT SURGERY     bilateral feet , 3 surgeries each  . HAND SURGERY     left  . JOINT REPLACEMENT    . KNEE ARTHROSCOPY    . ROTATOR CUFF REPAIR     left    OB History    No data available       Home Medications    Prior to Admission medications   Medication Sig Start Date End Date Taking? Authorizing Provider  hydrochlorothiazide (HYDRODIURIL) 25 MG tablet Take 25 mg by mouth daily.   Yes Historical Provider, MD  potassium chloride (K-DUR) 10 MEQ tablet Take 10 mEq by mouth daily.   Yes Historical Provider, MD  Pyridoxine HCl (B-6 PO) Take 300 mg by mouth.   Yes Historical Provider, MD  cholecalciferol (VITAMIN D) 1000 UNITS tablet Take 2,000 Units by mouth daily.    Historical  Provider, MD  diphenhydrAMINE (BENADRYL) 25 mg capsule Take 1 capsule (25 mg total) by mouth every 6 (six) hours as needed for allergies. 07/13/16   Loren Racer, MD  fish oil-omega-3 fatty acids 1000 MG capsule Take 2 g by mouth daily.    Historical Provider, MD  Flaxseed, Linseed, (FLAXSEED OIL PO) Take 1,000 mg by mouth.    Historical Provider, MD  glucosamine-chondroitin 500-400 MG tablet Take 1 tablet by mouth 3 (three) times daily.    Historical Provider, MD  Chilton Si Tea 315 MG CAPS Take by mouth 2 (two) times daily.    Historical Provider, MD  meloxicam (MOBIC) 15 MG tablet Take 15 mg by mouth daily.    Historical Provider, MD  metoprolol succinate (TOPROL-XL) 25 MG 24 hr tablet Take 25 mg by mouth daily.    Historical Provider, MD  Multiple Vitamin (MULTIVITAMIN) capsule Take 1 capsule by mouth daily.    Historical Provider, MD  omeprazole (PRILOSEC) 20 MG capsule Take 20 mg by mouth daily.    Historical Provider, MD  predniSONE (DELTASONE) 20 MG tablet 3 tabs po day one, then 2 po daily x 4 days 07/14/16   Loren Racer, MD  sulfamethoxazole-trimethoprim (BACTRIM DS,SEPTRA DS) 800-160 MG tablet Take 1 tablet by mouth 2 (two) times daily. 07/14/16 07/21/16  Shon Baton, MD  Family History History reviewed. No pertinent family history.  Social History Social History  Substance Use Topics  . Smoking status: Never Smoker  . Smokeless tobacco: Never Used  . Alcohol use No     Allergies   Tramadol; Aspirin; Codeine; Erythromycin; Flexeril [cyclobenzaprine]; and Stinging nettle [urtica dioica]   Review of Systems Review of Systems  Constitutional: Negative for chills, fatigue and fever.  HENT: Negative for facial swelling.   Respiratory: Negative for shortness of breath and wheezing.   Gastrointestinal: Negative for abdominal pain, constipation, diarrhea, nausea and vomiting.  Musculoskeletal: Positive for myalgias. Negative for back pain, neck pain and neck stiffness.    Skin: Positive for color change and rash. Negative for wound.  Neurological: Negative for dizziness, weakness, light-headedness, numbness and headaches.  All other systems reviewed and are negative.    Physical Exam Updated Vital Signs BP (!) 117/53 (BP Location: Right Arm)   Pulse 73   Temp 98.3 F (36.8 C) (Oral)   Resp 18   Ht  (1.676 m)   Wt 170 lb (77.1 kg)   SpO2 100%   BMI 27.44 kg/m   Physical Exam  Constitutional: She is oriented to person, place, and time. She appears well-developed and well-nourished. No distress.  HENT:  Head: Normocephalic and atraumatic.  Mouth/Throat: Oropharynx is clear and moist. No oropharyngeal exudate.  Eyes: EOM are normal. Pupils are equal, round, and reactive to light.  Neck: Normal range of motion. Neck supple.  Cardiovascular: Normal rate and regular rhythm.  Exam reveals no gallop and no friction rub.   No murmur heard. Pulmonary/Chest: Effort normal and breath sounds normal. No stridor. No respiratory distress. She has no wheezes. She has no rales. She exhibits no tenderness.  Abdominal: Soft. Bowel sounds are normal. There is no tenderness. There is no rebound and no guarding.  Musculoskeletal: Normal range of motion. She exhibits edema and tenderness.  Patient has erythema, tenderness to the left upper extremity in the deltoid region.Overlying erythematous plaque. 2+ distal pulses.   Neurological: She is alert and oriented to person, place, and time.  Moving all extremities without focal deficit. Sensation intact.  Skin: Skin is warm and dry. Capillary refill takes less than 2 seconds. Rash noted. There is erythema.  Psychiatric: She has a normal mood and affect. Her behavior is normal.  Nursing note and vitals reviewed.    ED Treatments / Results   DIAGNOSTIC STUDIES:   COORDINATION OF CARE: 10:29 PM-Discussed next steps with pt. Pt verbalized understanding and is agreeable with the plan.    Labs (all labs ordered  are listed, but only abnormal results are displayed) Labs Reviewed  BASIC METABOLIC PANEL - Abnormal; Notable for the following:       Result Value   Sodium 134 (*)    Chloride 99 (*)    Glucose, Bld 107 (*)    All other components within normal limits  CBC WITH DIFFERENTIAL/PLATELET    EKG  EKG Interpretation None       Radiology No results found.  Procedures Procedures (including critical care time)  Medications Ordered in ED Medications  methylPREDNISolone sodium succinate (SOLU-MEDROL) 125 mg/2 mL injection 125 mg (125 mg Intramuscular Given 07/13/16 2240)  diphenhydrAMINE (BENADRYL) capsule 25 mg (25 mg Oral Given 07/13/16 2239)  famotidine (PEPCID) tablet 20 mg (20 mg Oral Given 07/13/16 2240)     Initial Impression / Assessment and Plan / ED Course  I have reviewed the triage vital signs and the  nursing notes.  Pertinent labs & imaging results that were available during my care of the patient were reviewed by me and considered in my medical decision making (see chart for details).     With left upper extremity swelling, erythema and tenderness. No constitutional symptoms. She's been well-appearing. Question infectious versus allergic reaction. Will send basic blood work and treat as allergic reaction. Erythema has been marked. Patient has has normal WBC level. No worsening of the redness. Highly suspect localized allergic reaction. Will continue to monitor.  Data to oncoming emergency physician pending reevaluation. Anticipate discharge home. Final Clinical Impressions(s) / ED Diagnoses   Final diagnoses:  Allergic reaction to drug, initial encounter  Cellulitis of left upper extremity    New Prescriptions Discharge Medication List as of 07/14/2016 12:08 AM    START taking these medications   Details  diphenhydrAMINE (BENADRYL) 25 mg capsule Take 1 capsule (25 mg total) by mouth every 6 (six) hours as needed for allergies., Starting Wed 07/13/2016, Print      predniSONE (DELTASONE) 20 MG tablet 3 tabs po day one, then 2 po daily x 4 days, Print    sulfamethoxazole-trimethoprim (BACTRIM DS,SEPTRA DS) 800-160 MG tablet Take 1 tablet by mouth 2 (two) times daily., Starting Thu 07/14/2016, Until Thu 07/21/2016, Print       I personally performed the services described in this documentation, which was scribed in my presence. The recorded information has been reviewed and is accurate.       Loren Racer, MD 07/14/16 2230

## 2016-07-14 MED ORDER — SULFAMETHOXAZOLE-TRIMETHOPRIM 800-160 MG PO TABS: 1.0000 | ORAL_TABLET | Freq: Two times a day (BID) | ORAL | 0 refills | Status: AC

## 2016-07-14 NOTE — ED Provider Notes (Signed)
Patient signed out pending repeat check.  Patient received booster vaccination yesterday. Developed redness and erythema over the left arm. Reports pain. On my recheck, no significant improvement but no progression of redness beyond marked site. It is mildly erythematous. Patient is well-appearing and blood work is reassuring. Given no significant improvement with antihistamine, feel it reasonable to also cover for cellulitis. Patient states the Bactrim works very well for her. Will discharge with Bactrim as well as Benadryl. Follow with primary physician for recheck tomorrow.  After history, exam, and medical workup I feel the patient has been appropriately medically screened and is safe for discharge home. Pertinent diagnoses were discussed with the patient. Patient was given return precautions.    Shon Baton, MD 07/14/16 [phone]

## 2018-02-04 ENCOUNTER — Other Ambulatory Visit: Payer: Self-pay

## 2018-02-04 ENCOUNTER — Emergency Department (HOSPITAL_BASED_OUTPATIENT_CLINIC_OR_DEPARTMENT_OTHER): Payer: Medicare HMO

## 2018-02-04 ENCOUNTER — Emergency Department (HOSPITAL_BASED_OUTPATIENT_CLINIC_OR_DEPARTMENT_OTHER)
Admission: EM | Admit: 2018-02-04 | Discharge: 2018-02-04 | Disposition: A | Discharge status: Home / Self Care | Payer: Medicare HMO | Attending: Emergency Medicine | Admitting: Emergency Medicine

## 2018-02-04 ENCOUNTER — Encounter (HOSPITAL_BASED_OUTPATIENT_CLINIC_OR_DEPARTMENT_OTHER): Payer: Self-pay | Admitting: Emergency Medicine

## 2018-02-04 DIAGNOSIS — M25522 Pain in left elbow: Secondary | ICD-10-CM

## 2018-02-04 DIAGNOSIS — Z96659 Presence of unspecified artificial knee joint: Secondary | ICD-10-CM | POA: Diagnosis not present

## 2018-02-04 DIAGNOSIS — M25512 Pain in left shoulder: Secondary | ICD-10-CM | POA: Diagnosis not present

## 2018-02-04 DIAGNOSIS — M25531 Pain in right wrist: Secondary | ICD-10-CM

## 2018-02-04 DIAGNOSIS — I10 Essential (primary) hypertension: Secondary | ICD-10-CM | POA: Insufficient documentation

## 2018-02-04 DIAGNOSIS — M25562 Pain in left knee: Secondary | ICD-10-CM | POA: Diagnosis not present

## 2018-02-04 DIAGNOSIS — Y9301 Activity, walking, marching and hiking: Secondary | ICD-10-CM | POA: Diagnosis not present

## 2018-02-04 DIAGNOSIS — Y999 Unspecified external cause status: Secondary | ICD-10-CM | POA: Insufficient documentation

## 2018-02-04 DIAGNOSIS — W010XXA Fall on same level from slipping, tripping and stumbling without subsequent striking against object, initial encounter: Secondary | ICD-10-CM | POA: Diagnosis not present

## 2018-02-04 DIAGNOSIS — W19XXXA Unspecified fall, initial encounter: Secondary | ICD-10-CM

## 2018-02-04 DIAGNOSIS — S0083XA Contusion of other part of head, initial encounter: Secondary | ICD-10-CM

## 2018-02-04 DIAGNOSIS — S0990XA Unspecified injury of head, initial encounter: Secondary | ICD-10-CM | POA: Diagnosis present

## 2018-02-04 DIAGNOSIS — Y929 Unspecified place or not applicable: Secondary | ICD-10-CM | POA: Insufficient documentation

## 2018-02-04 NOTE — ED Notes (Signed)
ED Provider at bedside. 

## 2018-02-04 NOTE — Discharge Instructions (Signed)
You were seen in the ED today after a fall. Your CT scans and x-rays are mostly normal. Your left knee x-ray shows some irregularity over the patella. I have listed an orthopedic surgery practice on the paperwork. Call tomorrow to schedule an appointment for the next week. I suspect that you may have a small tare in the quadriceps tendon.   Return to the ED with any sudden severe headache, worsening pain, chest pain, shortness of breath, or additional falls.

## 2018-02-04 NOTE — ED Provider Notes (Signed)
Emergency Department Provider Note   I have reviewed the triage vital signs and the nursing notes.   HISTORY  Chief Complaint Fall   HPI Heather Pitts is a 83 y.o. female with PMH of HTN, reflux, and kidney stones presents to the emergency department for evaluation after fall yesterday.  The patient was walking when she misstepped and fell to the ground.  She primarily landed on her left side.  She states that her face scraped across the ground.  She did not lose consciousness.  She is not on anticoagulation.  She was able to be helped to her feet but developed soreness in multiple areas as the day went on.  She has been ambulatory but is noticed bruising over her left knee, left shoulder, and right wrist.  She developed an abrasion to the left face and forehead.  She states she cleaned it right away but is having some burning pain in that area.  No vision changes.  She does have a mild to moderate headache.  Denies any neck or back pain.  Past Medical History:  Diagnosis Date  . Hypertension   . Irregular heart beat   . Kidney stone   . Reflux     There are no active problems to display for this patient.   Past Surgical History:  Procedure Laterality Date  . BACK SURGERY    . FOOT SURGERY     bilateral feet , 3 surgeries each  . HAND SURGERY     left  . JOINT REPLACEMENT    . KNEE ARTHROSCOPY    . ROTATOR CUFF REPAIR     left    Allergies Tramadol; Aspirin; Codeine; Erythromycin; Flexeril [cyclobenzaprine]; and Stinging nettle [urtica dioica]  No family history on file.  Social History Social History   Tobacco Use  . Smoking status: Never Smoker  . Smokeless tobacco: Never Used  Substance Use Topics  . Alcohol use: No  . Drug use: No    Review of Systems  Constitutional: No fever/chills Eyes: No visual changes. ENT: No sore throat. Cardiovascular: Denies chest pain. Respiratory: Denies shortness of breath. Gastrointestinal: No abdominal pain.  No  nausea, no vomiting.  No diarrhea.  No constipation. Genitourinary: Negative for dysuria. Musculoskeletal: Positive left shoulder/elbow pain. Positive left knee pain and bruising. Pain at the base of the right thumb.  Skin: Negative for rash. Abrasion to the left face/forehead.  Neurological: Negative for headaches, focal weakness or numbness.  10-point ROS otherwise negative.  ____________________________________________   PHYSICAL EXAM:  VITAL SIGNS: ED Triage Vitals  Enc Vitals Group     BP 02/04/18 1113 (!) 180/63     Pulse Rate 02/04/18 1113 67     Resp 02/04/18 1113 16     Temp 02/04/18 1113 98.3 F (36.8 C)     Temp Source 02/04/18 1113 Oral     SpO2 02/04/18 1113 99 %     Weight 02/04/18 1111 190 lb (86.2 kg)     Height 02/04/18 1111 5' 7.5" (1.715 m)     Pain Score 02/04/18 1111 8   Constitutional: Alert and oriented. Well appearing and in no acute distress. Eyes: Conjunctivae are normal. PERRL.  Head: Abrasion to the left forehead and left cheek. Mild swelling in this area.  Nose: No congestion/rhinnorhea. Mouth/Throat: Mucous membranes are moist.  Neck: No stridor. No cervical spine tenderness to palpation. Cardiovascular: Normal rate, regular rhythm. Good peripheral circulation. Grossly normal heart sounds.   Respiratory: Normal respiratory  effort.  No retractions. Lungs CTAB. Gastrointestinal: Soft and nontender. No distention.  Musculoskeletal: Bruising over the left shoulder with mild pain with ROM. Mild left elbow pain. Bruising and swelling over the left knee. Mild bruising at the base of the right thumb. No scaphoid tenderness to palpation bilaterally.  Neurologic:  Normal speech and language. No gross focal neurologic deficits are appreciated.  Skin:  Skin is warm and dry. Abrasion to the forehead and left cheek.   ____________________________________________  RADIOLOGY  Dg Elbow Complete Left  Result Date: 02/04/2018 CLINICAL DATA:  Larey Seat yesterday.   Left elbow pain. EXAM: LEFT ELBOW - COMPLETE 3+ VIEW COMPARISON:  None. FINDINGS: Elbow joint degenerative changes but no acute fracture or joint effusion. Small olecranon spur is noted. IMPRESSION: No acute fracture or joint effusion. Electronically Signed   By: Rudie Meyer M.D.   On: 02/04/2018 13:58   Dg Wrist Complete Right  Result Date: 02/04/2018 CLINICAL DATA:  Larey Seat.  Right wrist pain. EXAM: RIGHT WRIST - COMPLETE 3+ VIEW COMPARISON:  None. FINDINGS: Advanced degenerative changes involving the wrist and carpometacarpal joint of the thumb with joint space narrowing, spurring and subchondral cystic change versus erosions. Chondrocalcinosis is noted. No acute fracture is identified. Extensive vascular calcifications are noted. 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 No acute fracture. Electronically Signed   By: Rudie Meyer M.D.   On: 02/04/2018 13:05   Ct Head Wo Contrast  Result Date: 02/04/2018 CLINICAL DATA:  Larey Seat yesterday and hit head. EXAM: CT HEAD WITHOUT CONTRAST CT MAXILLOFACIAL WITHOUT CONTRAST TECHNIQUE: Multidetector CT imaging of the head and maxillofacial structures were performed using the standard protocol without intravenous contrast. Multiplanar CT image reconstructions of the maxillofacial structures were also generated. COMPARISON:  None. FINDINGS: CT HEAD FINDINGS Brain: Minimal age related cerebral atrophy, ventriculomegaly and periventricular white matter disease. No acute intracranial findings such as hemispheric infarction or intracranial hemorrhage. No mass lesions or extra-axial fluid collections are identified. The brainstem and cerebellum are grossly normal. Vascular: No hyperdense vessel or unexpected calcification. Skull: No skull fracture or bone lesion. Other: There is a left frontal scalp hematoma but no radiopaque foreign body or underlying fracture. CT MAXILLOFACIAL FINDINGS Osseous: No acute facial bone fractures are identified.  The mandibular condyles are normally located. Orbits: The orbits are intact. No orbital fractures or intraorbital hematoma. The globes are intact. Sinuses: The paranasal sinuses and mastoid air cells are clear. Soft tissues: Left frontal scalp hematoma but no facial hematomas. IMPRESSION: 1. No acute intracranial findings or skull fracture. 2. Left frontal scalp hematoma but no radiopaque foreign body or underlying skull fracture. 3. No acute facial bone fractures. Electronically Signed   By: Rudie Meyer M.D.   On: 02/04/2018 12:24   Dg Shoulder Left  Result Date: 02/04/2018 CLINICAL DATA:  Fall EXAM: LEFT SHOULDER - 2+ VIEW COMPARISON:  None. FINDINGS: Postsurgical change of prior rotator cuff repair. No fracture or dislocation at the left shoulder. Glenohumeral joint is approximated. Mild glenohumeral osteoarthrosis. Poor visualization of the acromioclavicular joint. IMPRESSION: 1. No fracture or glenohumeral dislocation. 2. Limited visualization of the clavicle and acromioclavicular joint. If there is concern for clavicle fracture or shoulder separation, dedicated clavicle views are recommended. Electronically Signed   By: Deatra Robinson M.D.   On: 02/04/2018 13:03   Dg Knee Complete 4 Views Left  Result Date: 02/04/2018 CLINICAL DATA:  Larey Seat.  Left knee pain. EXAM: LEFT KNEE - COMPLETE 4+ VIEW COMPARISON:  None. FINDINGS: Total left knee  arthroplasty and good position without complicating features. No periprosthetic fracture. Loose ossified bodies are noted in the suprapatellar bursa. Also, the superior margin of the patella is irregular and could not exclude avulsion fracture. The quadriceps tendon is not well demonstrated. Recommend clinical correlation with any possibility of quadriceps tendon rupture. The patellar tendon appears normal. IMPRESSION: Intact components of a total knee arthroplasty. No periprosthetic fracture. Quadriceps tendon not well demonstrated. Recommend clinical correlation with  possibility of quadriceps tendon rupture. Electronically Signed   By: Rudie Meyer M.D.   On: 02/04/2018 13:06   Ct Maxillofacial Wo Contrast  Result Date: 02/04/2018 CLINICAL DATA:  Larey Seat yesterday and hit head. EXAM: CT HEAD WITHOUT CONTRAST CT MAXILLOFACIAL WITHOUT CONTRAST TECHNIQUE: Multidetector CT imaging of the head and maxillofacial structures were performed using the standard protocol without intravenous contrast. Multiplanar CT image reconstructions of the maxillofacial structures were also generated. COMPARISON:  None. FINDINGS: CT HEAD FINDINGS Brain: Minimal age related cerebral atrophy, ventriculomegaly and periventricular white matter disease. No acute intracranial findings such as hemispheric infarction or intracranial hemorrhage. No mass lesions or extra-axial fluid collections are identified. The brainstem and cerebellum are grossly normal. Vascular: No hyperdense vessel or unexpected calcification. Skull: No skull fracture or bone lesion. Other: There is a left frontal scalp hematoma but no radiopaque foreign body or underlying fracture. CT MAXILLOFACIAL FINDINGS Osseous: No acute facial bone fractures are identified. The mandibular condyles are normally located. Orbits: The orbits are intact. No orbital fractures or intraorbital hematoma. The globes are intact. Sinuses: The paranasal sinuses and mastoid air cells are clear. Soft tissues: Left frontal scalp hematoma but no facial hematomas. IMPRESSION: 1. No acute intracranial findings or skull fracture. 2. Left frontal scalp hematoma but no radiopaque foreign body or underlying skull fracture. 3. No acute facial bone fractures. Electronically Signed   By: Rudie Meyer M.D.   On: 02/04/2018 12:24    ____________________________________________   PROCEDURES  Procedure(s) performed:   Procedures  None ____________________________________________   INITIAL IMPRESSION / ASSESSMENT AND PLAN / ED COURSE  Pertinent labs & imaging  results that were available during my care of the patient were reviewed by me and considered in my medical decision making (see chart for details).  Patient presents to the emergency department after fall.  She has various abrasions, hematomas, areas of swelling as noted above.  Plan for imaging of the head and face.  No concern for distracting injury.  Patient has no cervical spine tenderness. Not anticoagulated.   7:17 AM CT and plain film images reviewed.  I went back to evaluate the patient's left knee in greater detail.  She is able to straight leg raise without occult he.  She has full range of motion of the knee and is able to resist me with both flexion and extension of the knee.  My suspicion for quadriceps tendon rupture clinically is very low.  She may have a partial tear given the irregularity seen at the superior patella.  She is ambulatory here.  My plan is to have her follow-up with orthopedics for further evaluation in the coming week to see if MRI is required.  I do not feel emergent MRI is necessary at this time.  The patient has no tenderness over the left clavicle.  Went to provide DME prescription for a walker to use at home as needed.  Patient takes extra strength Tylenol as needed for pain which seems appropriate.   Plain films of the elbow reviewed. Patient ambulatory here  without difficulty. Will f/u with ortho in the coming week. Provided contact information for on-call provider. She does not wish to return to the surgeon who performed her knee replacement. Wrote DME Rx for walker.   At this time, I do not feel there is any life-threatening condition present. I have reviewed and discussed all results (EKG, imaging, lab, urine as appropriate), exam findings with patient. I have reviewed nursing notes and appropriate previous records.  I feel the patient is safe to be discharged home without further emergent workup. Discussed usual and customary return precautions. Patient and  family (if present) verbalize understanding and are comfortable with this plan.  Patient will follow-up with their primary care provider. If they do not have a primary care provider, information for follow-up has been provided to them. All questions have been answered.  ____________________________________________  FINAL CLINICAL IMPRESSION(S) / ED DIAGNOSES  Final diagnoses:  Fall, initial encounter  Injury of head, initial encounter  Contusion of face, initial encounter  Acute pain of left shoulder  Left elbow pain  Right wrist pain  Acute pain of left knee    Note:  This document was prepared using Dragon voice recognition software and may include unintentional dictation errors.  Alona Bene, MD Emergency Medicine    Daion Ginsberg, Arlyss Repress, MD 02/05/18 [phone]

## 2018-02-04 NOTE — ED Triage Notes (Signed)
Pt tripped and fell yesterday, landing on cement. Pt has abrasions noted to L side of her face. Also c/o L shoulder and L knee pain. Denies LOC. She does not take blood thinners.

## 2018-02-21 ENCOUNTER — Encounter (INDEPENDENT_AMBULATORY_CARE_PROVIDER_SITE_OTHER): Payer: Self-pay | Admitting: Orthopaedic Surgery

## 2018-02-21 ENCOUNTER — Ambulatory Visit (INDEPENDENT_AMBULATORY_CARE_PROVIDER_SITE_OTHER): Payer: Medicare HMO | Admitting: Orthopaedic Surgery

## 2018-02-21 DIAGNOSIS — S8002XA Contusion of left knee, initial encounter: Secondary | ICD-10-CM

## 2018-02-21 NOTE — Progress Notes (Signed)
Office Visit Note   Patient: Heather Pitts           Date of Birth: 1934/11/11           MRN: [id number] Visit Date: 02/21/2018              Requested by: Herma Carson, MD [address] [place], [place] [address] PCP: Herma Carson, MD   Assessment & Plan: Visit Diagnoses:  1. Contusion of left knee, initial encounter     Plan: She is doing well and have gave her reassurance that the patella pain will subside with time and do not see any thing that needs any type of intervention today.  All question concerns were answered and addressed.  She will continue use her cane and increase her activities as comfort allows.  All question concerns were answered and addressed Liquicet before and she will come back and see [place] if needed.  Follow-Up Instructions: Return if symptoms worsen or fail to improve.   Orders:  No orders of the defined types were placed in this encounter.  No orders of the defined types were placed in this encounter.     Procedures: No procedures performed   Clinical Data: No additional findings.   Subjective: Chief Complaint  Patient presents with  . Left Knee - Pain  Patient is very pleasant and active 83 year old female is coming to see me today since a mechanical fall on 02/03/2018 injuring her face on the left side her left shoulder her left elbow her left knee and her left ankle.  She has a history of a left total knee arthroplasty done in [facility] in 2000.  She does use a cane when she ambulates.  She has known arthritis in her right knee.  Her left knee is been painful patella.  X-rays are on the canopy system for interview of all these areas.  She is getting around better than she did right after the fall and she feels okay.  Her daughter is with her.  HPI  Review of Systems She currently denies any headache, chest pain, shortness of breath, fever, chills, nausea, vomiting.  Objective: Vital Signs: There were no vitals taken for  this visit.  Physical Exam She is alert and oriented x3 and in no acute distress Ortho Exam Examination of her left knee shows that she can extend her knee easily and her extensor mechanism is intact and strong.  There is bruising blood around her knee especially in the back.  I do feel that she is likely torn her gastroc muscle where she is tender but functionally she is doing great.  The knee itself feels ligaments is stable and there is no knee joint effusion.  Her left elbow and left shoulder exam are normal.  Her left ankle exam is normal. Specialty Comments:  No specialty comments available.  Imaging: No results found. X-rays independently reviewed on the canopy system of her left shoulder left wrist left knee all show no acute findings.  The knee replacement itself shows no evidence of mechanical loosening.  PMFS History: There are no active problems to display for this patient.  Past Medical History:  Diagnosis Date  . Hypertension   . Irregular heart beat   . Kidney stone   . Reflux     History reviewed. No pertinent family history.  Past Surgical History:  Procedure Laterality Date  . BACK SURGERY    . FOOT SURGERY     bilateral  feet , 3 surgeries each  . HAND SURGERY     left  . JOINT REPLACEMENT    . KNEE ARTHROSCOPY    . ROTATOR CUFF REPAIR     left   Social History   Occupational History  . Not on file  Tobacco Use  . Smoking status: Never Smoker  . Smokeless tobacco: Never Used  Substance and Sexual Activity  . Alcohol use: No  . Drug use: No  . Sexual activity: Not on file

## 2019-06-24 IMAGING — DX DG KNEE COMPLETE 4+V*L*
4 series · 4 of 4 positions shown · non-contrast
Comparison: None.

CLINICAL DATA: Fell.  Left knee pain.

EXAM:
LEFT KNEE - COMPLETE 4+ VIEW

[knee ap]
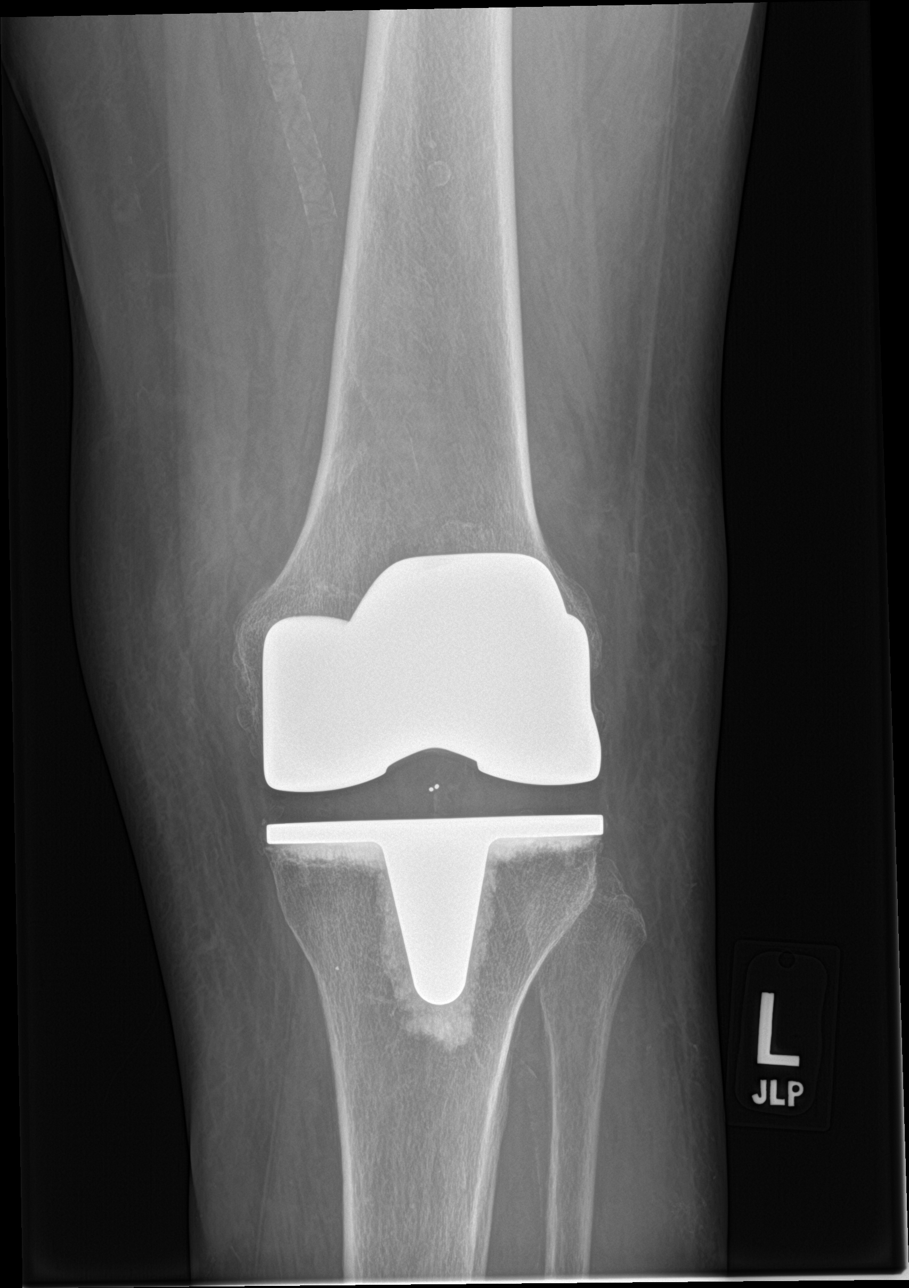

[knee obl (1 of 2)]
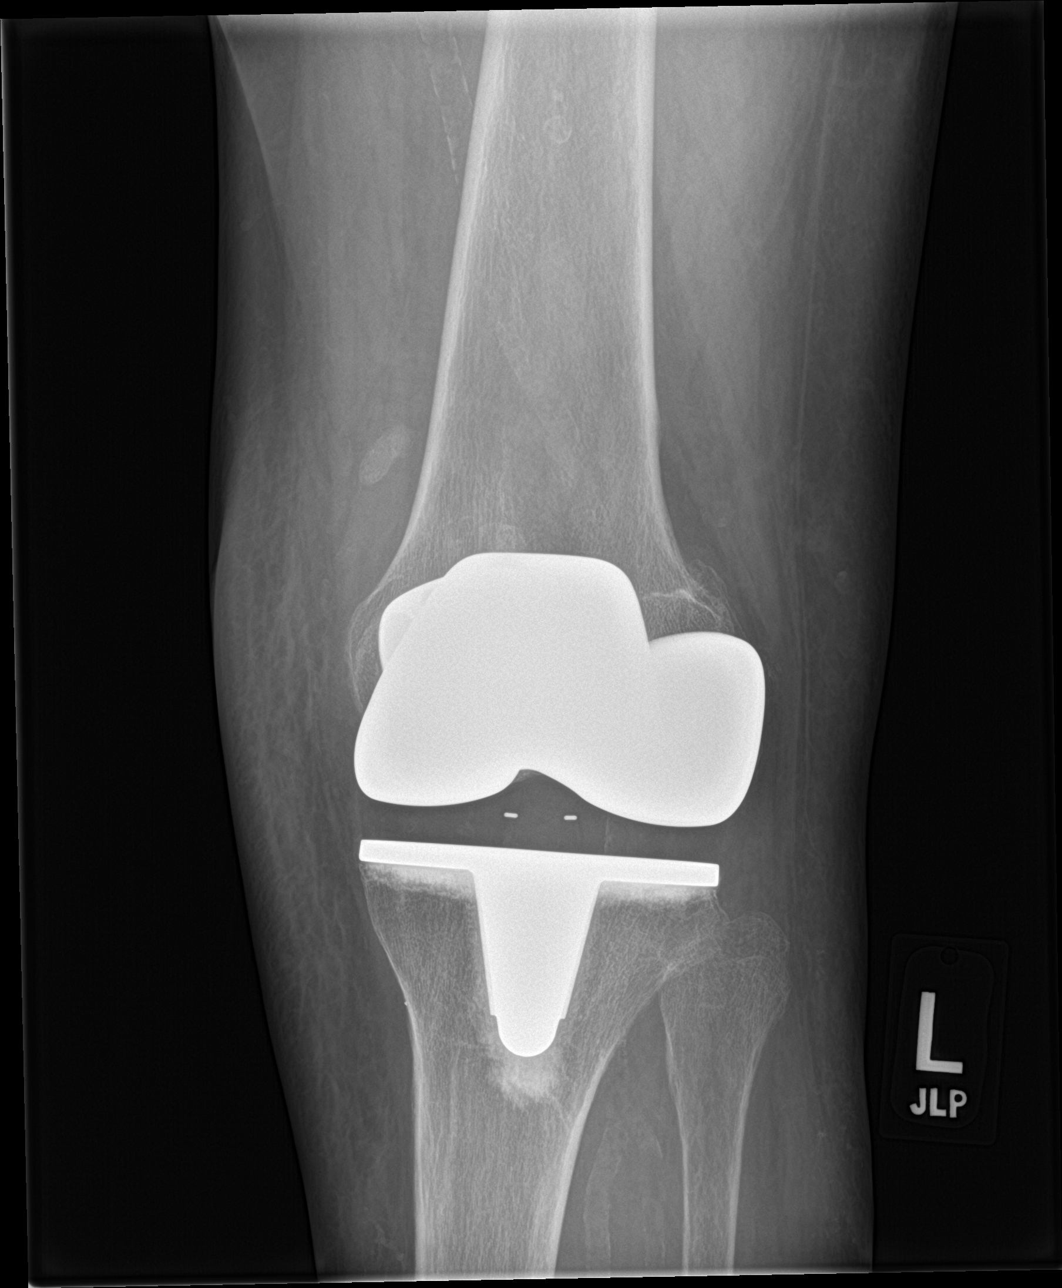

[knee obl (2 of 2)]
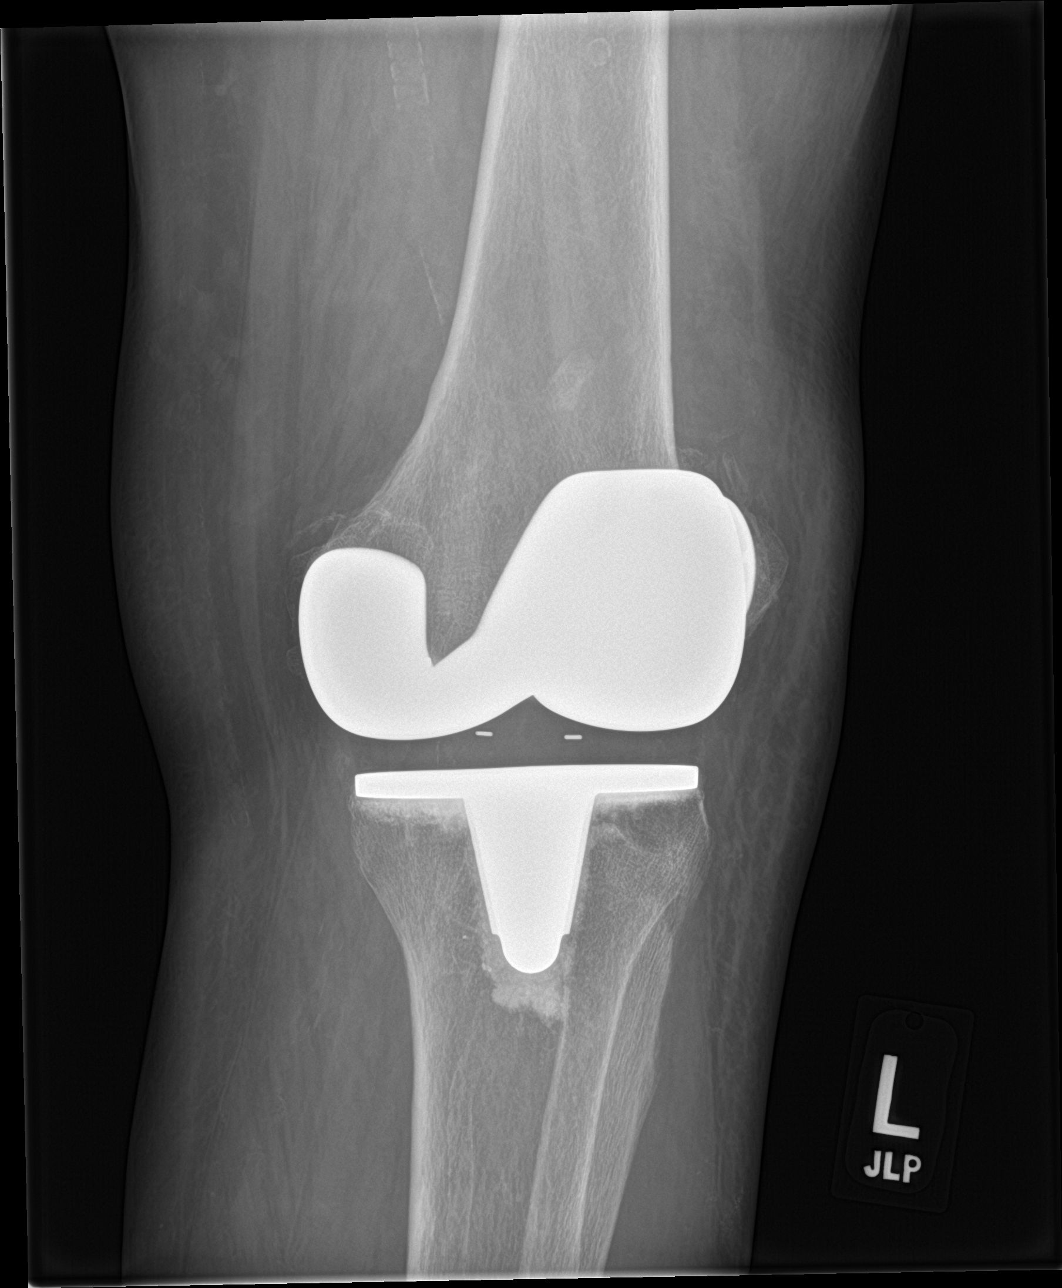

[knee lat]
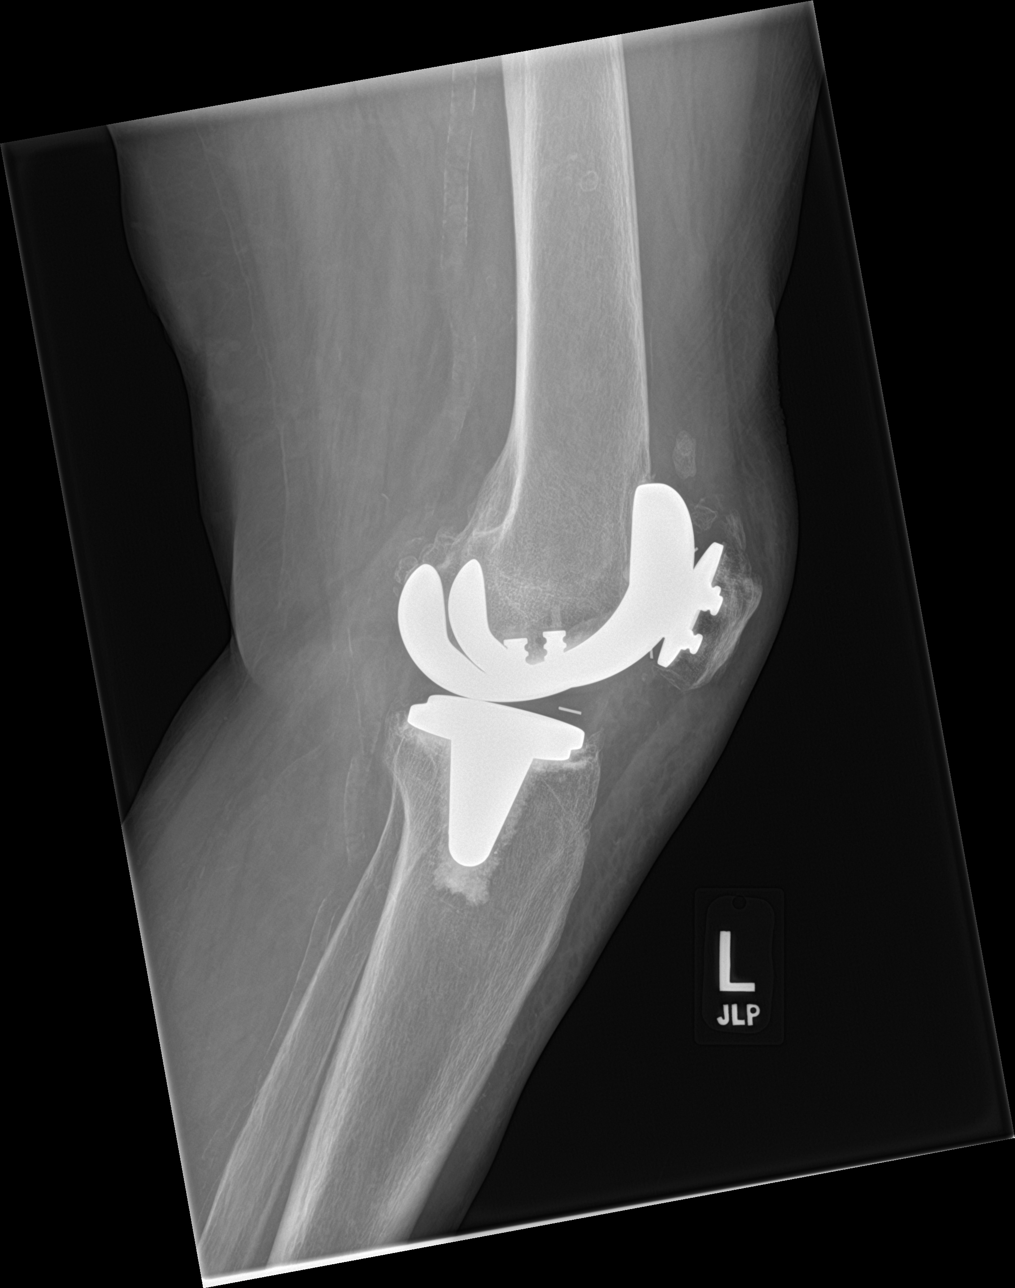

[4 of 4 positions shown; findings below may reference images not displayed]

FINDINGS: Total left knee arthroplasty and good position without complicating
features. No periprosthetic fracture. Loose ossified bodies are
noted in the suprapatellar bursa. Also, the superior margin of the
patella is irregular and could not exclude avulsion fracture. The
quadriceps tendon is not well demonstrated. Recommend clinical
correlation with any possibility of quadriceps tendon rupture. The
patellar tendon appears normal.
IMPRESSION: Intact components of a total knee arthroplasty. No periprosthetic
fracture.

Quadriceps tendon not well demonstrated. Recommend clinical
correlation with possibility of quadriceps tendon rupture.

## 2020-07-10 HISTORY — PX: REVERSE SHOULDER ARTHROPLASTY: SHX5054

## 2021-02-18 ENCOUNTER — Emergency Department (HOSPITAL_BASED_OUTPATIENT_CLINIC_OR_DEPARTMENT_OTHER)
Admission: EM | Admit: 2021-02-18 | Discharge: 2021-02-18 | Disposition: A | Discharge status: Home / Self Care | Payer: Medicare Other | Attending: Emergency Medicine | Admitting: Emergency Medicine

## 2021-02-18 ENCOUNTER — Other Ambulatory Visit: Payer: Self-pay

## 2021-02-18 ENCOUNTER — Encounter (HOSPITAL_BASED_OUTPATIENT_CLINIC_OR_DEPARTMENT_OTHER): Payer: Self-pay

## 2021-02-18 DIAGNOSIS — Z79899 Other long term (current) drug therapy: Secondary | ICD-10-CM | POA: Insufficient documentation

## 2021-02-18 DIAGNOSIS — I10 Essential (primary) hypertension: Secondary | ICD-10-CM | POA: Diagnosis not present

## 2021-02-18 DIAGNOSIS — Z96611 Presence of right artificial shoulder joint: Secondary | ICD-10-CM | POA: Insufficient documentation

## 2021-02-18 DIAGNOSIS — L509 Urticaria, unspecified: Secondary | ICD-10-CM | POA: Diagnosis not present

## 2021-02-18 MED ORDER — FAMOTIDINE 20 MG PO TABS
40.0000 mg | ORAL_TABLET | Freq: Once | ORAL | Status: AC
  Administered 2021-02-18: 40 mg via ORAL
  Filled 2021-02-18: qty 2

## 2021-02-18 MED ORDER — DIPHENHYDRAMINE HCL 25 MG PO CAPS
50.0000 mg | ORAL_CAPSULE | Freq: Once | ORAL | Status: AC
  Administered 2021-02-18: 50 mg via ORAL
  Filled 2021-02-18: qty 2

## 2021-02-18 NOTE — Discharge Instructions (Addendum)
Take Benadryl (diphenhydramine) 50 mg (2 over-the-counter tablets or capsules) every 6 hours as needed for symptoms.  Alternatively you may take over-the-counter Allegra or Zyrtec.  Also take Pepcid (famotidine) 20 mg twice daily to treat your stomach upset.

## 2021-02-18 NOTE — ED Provider Notes (Signed)
[facility] Provider Note: Heather Dell, MD, FACEP  CSN: [id number] MRN: [id number] ARRIVAL: 02/18/21 at 0137 ROOM: [facility]02/[facility]02   CHIEF COMPLAINT  Allergic Reaction   HISTORY OF PRESENT ILLNESS  02/18/21 1:54 AM Heather Pitts ALTHA Pitts is a 86 y.o. female who got a flu shot 2 days ago and broke out in hives that evening.  The hives were generalized except her lower legs.  They are moderately pruritic.  She took 2 Benadryl yesterday evening about 10 PM with partial relief.  She denies any associated throat swelling, shortness of breath, nausea, vomiting or diarrhea.  She has had a sensation of "indigestion" in the center of her epigastrium which was not relieved with Tums.  Symptoms are moderate.   Past Medical History:  Diagnosis Date   Hypertension    Irregular heart beat    Kidney stone    Reflux     Past Surgical History:  Procedure Laterality Date   BACK SURGERY     cataracts     FOOT SURGERY     bilateral feet , 3 surgeries each   HAND SURGERY     left   JOINT REPLACEMENT     KNEE ARTHROSCOPY     REVERSE SHOULDER ARTHROPLASTY Right 07/2020   ROTATOR CUFF REPAIR     left    History reviewed. No pertinent family history.  Social History   Tobacco Use   Smoking status: Never   Smokeless tobacco: Never  Substance Use Topics   Alcohol use: No   Drug use: No    Prior to Admission medications   Medication Sig Start Date End Date Taking? Authorizing Provider  cholecalciferol (VITAMIN D) 1000 UNITS tablet Take 2,000 Units by mouth daily.    [provider]  fish oil-omega-3 fatty acids 1000 MG capsule Take 2 g by mouth daily.    [provider]  Flaxseed, Linseed, (FLAXSEED OIL PO) Take 1,000 mg by mouth.    [provider]  glucosamine-chondroitin 500-400 MG tablet Take 1 tablet by mouth 3 (three) times daily.    [provider]  Chilton Si Tea 315 MG CAPS Take by mouth 2 (two) times daily.    [provider]   hydrochlorothiazide (HYDRODIURIL) 25 MG tablet Take 25 mg by mouth daily.    [provider]  meloxicam (MOBIC) 15 MG tablet Take 15 mg by mouth daily.    [provider]  metoprolol succinate (TOPROL-XL) 25 MG 24 hr tablet Take 25 mg by mouth daily.    [provider]  Multiple Vitamin (MULTIVITAMIN) capsule Take 1 capsule by mouth daily.    [provider]  omeprazole (PRILOSEC) 20 MG capsule Take 20 mg by mouth daily.    [provider]  potassium chloride (K-DUR) 10 MEQ tablet Take 10 mEq by mouth daily.    [provider]  Pyridoxine HCl (B-6 PO) Take 300 mg by mouth.    [provider]    Allergies Bee venom, Meperidine, Morphine, Tramadol, Aspirin, Codeine, Cortisone, Erythromycin, Flexeril [cyclobenzaprine], Shellfish allergy, Stevioside, Stinging nettle [urtica dioica], and Tape   REVIEW OF SYSTEMS  Negative except as noted here or in the History of Present Illness.   PHYSICAL EXAMINATION  Initial Vital Signs Blood pressure (!) 184/74, pulse 73, temperature 98.2 F (36.8 C), temperature source Oral, resp. rate 20, height 5\' 7"  (1.702 m), weight 86.2 kg, SpO2 100 %.  Examination General: Well-developed, well-nourished female in no acute distress; appearance consistent with age  of record HENT: normocephalic; atraumatic; pharynx normal Eyes: Normal appearance Neck: supple Heart: regular rate and rhythm Lungs: clear to auscultation bilaterally Abdomen: soft; nondistended; nontender; bowel sounds present Extremities: No deformity; full range of motion Neurologic: Awake, alert and oriented; motor function intact in all extremities and symmetric; no facial droop Skin: Warm and dry; generalized large urticarial plaques Psychiatric: Normal mood and affect   RESULTS  Summary of this visit's results, reviewed and interpreted by myself:   EKG Interpretation  Date/Time:    Ventricular Rate:    PR Interval:    QRS  Duration:   QT Interval:    QTC Calculation:   R Axis:     Text Interpretation:         Laboratory Studies: No results found for this or any previous visit (from the past 24 hour(s)). Imaging Studies: No results found.  ED COURSE and MDM  Nursing notes, initial and subsequent vitals signs, including pulse oximetry, reviewed and interpreted by myself.  Vitals:   02/18/21 0144 02/18/21 0145  BP: (!) 184/74   Pulse: 73   Resp: 20   Temp: 98.2 F (36.8 C)   TempSrc: Oral   SpO2: 100%   Weight:  86.2 kg  Height:  5\' 7"  (1.702 m)   Medications  famotidine (PEPCID) tablet 40 mg (has no administration in time range)  diphenhydrAMINE (BENADRYL) capsule 50 mg (has no administration in time range)   Patient advised to take Benadryl 50 mg every 6 hours.  As an alternative she may take over-the-counter Allegra or Zyrtec.  We will also have her start Pepcid 20 mg twice daily for her GI symptoms.  She declined steroids as she does not tolerate steroids well.  I do not believe her symptoms are severe enough to warrant steroids at this time.   PROCEDURES  Procedures   ED DIAGNOSES     ICD-10-CM   1. Urticaria  L50.9          Heather Jaye, MD 02/18/21 [phone]

## 2021-02-18 NOTE — ED Triage Notes (Signed)
Patient got flu shot on Tuesday and broke out in hives Tuesday night. Hives cover entire body except below the knees. Patient tried benadryl with some relief at 10pm last night

## 2021-02-19 ENCOUNTER — Emergency Department (HOSPITAL_BASED_OUTPATIENT_CLINIC_OR_DEPARTMENT_OTHER)
Admission: EM | Admit: 2021-02-19 | Discharge: 2021-02-19 | Disposition: A | Discharge status: Home / Self Care | Payer: Medicare Other | Attending: Emergency Medicine | Admitting: Emergency Medicine

## 2021-02-19 ENCOUNTER — Other Ambulatory Visit: Payer: Self-pay

## 2021-02-19 ENCOUNTER — Encounter (HOSPITAL_BASED_OUTPATIENT_CLINIC_OR_DEPARTMENT_OTHER): Payer: Self-pay | Admitting: Urology

## 2021-02-19 DIAGNOSIS — I10 Essential (primary) hypertension: Secondary | ICD-10-CM | POA: Diagnosis not present

## 2021-02-19 DIAGNOSIS — Z79899 Other long term (current) drug therapy: Secondary | ICD-10-CM | POA: Insufficient documentation

## 2021-02-19 DIAGNOSIS — L509 Urticaria, unspecified: Secondary | ICD-10-CM | POA: Insufficient documentation

## 2021-02-19 DIAGNOSIS — Z96611 Presence of right artificial shoulder joint: Secondary | ICD-10-CM | POA: Insufficient documentation

## 2021-02-19 DIAGNOSIS — R21 Rash and other nonspecific skin eruption: Secondary | ICD-10-CM

## 2021-02-19 MED ORDER — HYDROXYZINE HCL 25 MG PO TABS
25.0000 mg | ORAL_TABLET | Freq: Once | ORAL | Status: AC
  Administered 2021-02-19: 25 mg via ORAL
  Filled 2021-02-19: qty 1

## 2021-02-19 MED ORDER — PREDNISONE 20 MG PO TABS: 40.0000 mg | ORAL_TABLET | Freq: Every day | ORAL | 0 refills | Status: AC

## 2021-02-19 MED ORDER — PREDNISONE 20 MG PO TABS
40.0000 mg | ORAL_TABLET | Freq: Once | ORAL | Status: AC
  Administered 2021-02-19: 40 mg via ORAL
  Filled 2021-02-19: qty 2

## 2021-02-19 NOTE — Discharge Instructions (Addendum)
Please continue taking your Benadryl, hydroxyzine, Pepcid as prescribed, in addition to adding new prednisone.  Additionally I recommend Sarna anti-itch lotion, heavy ointment or cream such as Cetaphil, CeraVe specially itchy areas.  Please take caution with excessive itching of rash, as you can open up your skin to possibility of secondary infection.  Please monitor for increasing redness, swelling, pus.  Please follow-up with your primary care as needed.

## 2021-02-19 NOTE — ED Provider Notes (Signed)
[facility] EMERGENCY DEPARTMENT Provider Note   CSN: [id number] Arrival date & time: 02/19/21  0941     History Chief Complaint  Patient presents with   Rash    Heather Pitts is a 86 y.o. female past medical history significant for hypertension, irregular heartbeats, multiple reported allergic reactions who presents after receiving her flu vaccine 2 days ago, and having an allergic reaction.  Patient reported to the emergency department 2 days ago, received Benadryl, Pepcid, declined corticosteroids at that time.  Patient had reported some epigastric burning sensation at this time that improved with Tums, completely cleared with Pepcid.  At this time patient is now reporting worsening rash, itching, some swelling of the right eyelid, lower lip, and behind the ears.  Patient has been taking her Benadryl, Pepcid, as well as hydroxyzine as prescribed with some relief although Benadryl not granting her as much relief as it had this morning.  Patient denies racing heart, difficulty breathing, swelling of the tongue or the back of her throat, sense of impending doom, ongoing GI symptoms.   Rash     Past Medical History:  Diagnosis Date   Hypertension    Irregular heart beat    Kidney stone    Reflux     There are no problems to display for this patient.   Past Surgical History:  Procedure Laterality Date   BACK SURGERY     cataracts     FOOT SURGERY     bilateral feet , 3 surgeries each   HAND SURGERY     left   JOINT REPLACEMENT     KNEE ARTHROSCOPY     REVERSE SHOULDER ARTHROPLASTY Right 07/2020   ROTATOR CUFF REPAIR     left     OB History   No obstetric history on file.     History reviewed. No pertinent family history.  Social History   Tobacco Use   Smoking status: Never   Smokeless tobacco: Never  Substance Use Topics   Alcohol use: No   Drug use: No    Home Medications Prior to Admission medications   Medication Sig Start Date End  Date Taking? Authorizing Provider  predniSONE (DELTASONE) 20 MG tablet Take 2 tablets (40 mg total) by mouth daily for 4 days. 02/20/21 02/24/21 Yes Deasiah Hagberg H, PA-C  cholecalciferol (VITAMIN D) 1000 UNITS tablet Take 2,000 Units by mouth daily.    [provider]  fish oil-omega-3 fatty acids 1000 MG capsule Take 2 g by mouth daily.    [provider]  Flaxseed, Linseed, (FLAXSEED OIL PO) Take 1,000 mg by mouth.    [provider]  glucosamine-chondroitin 500-400 MG tablet Take 1 tablet by mouth 3 (three) times daily.    [provider]  Chilton Si Tea 315 MG CAPS Take by mouth 2 (two) times daily.    [provider]  hydrochlorothiazide (HYDRODIURIL) 25 MG tablet Take 25 mg by mouth daily.    [provider]  meloxicam (MOBIC) 15 MG tablet Take 15 mg by mouth daily.    [provider]  metoprolol succinate (TOPROL-XL) 25 MG 24 hr tablet Take 25 mg by mouth daily.    [provider]  Multiple Vitamin (MULTIVITAMIN) capsule Take 1 capsule by mouth daily.    [provider]  omeprazole (PRILOSEC) 20 MG capsule Take 20 mg by mouth daily.    [provider]  potassium chloride (K-DUR) 10 MEQ tablet Take 10 mEq by mouth daily.  [provider]  Pyridoxine HCl (B-6 PO) Take 300 mg by mouth.    [provider]    Allergies    Bee venom, Meperidine, Morphine, Tramadol, Aspirin, Codeine, Cortisone, Erythromycin, Flexeril [cyclobenzaprine], Shellfish allergy, Stevioside, Stinging nettle [urtica dioica], and Tape  Review of Systems   Review of Systems  Skin:  Positive for rash.  All other systems reviewed and are negative.  Physical Exam Updated Vital Signs BP 133/69 (BP Location: Right Arm)   Pulse 82   Temp 98.3 F (36.8 C) (Oral)   Resp 18   Ht 5\' 7"  (1.702 m)   Wt 86.2 kg   SpO2 100%   BMI 29.76 kg/m   Physical Exam Vitals and nursing note reviewed.  Constitutional:       General: She is not in acute distress.    Appearance: Normal appearance.  HENT:     Head: Normocephalic and atraumatic.     Mouth/Throat:     Comments: Pharynx clear, no angioedema of the tongue, no swelling of the back of the throat, tonsils, or uvula Eyes:     General:        Right eye: No discharge.        Left eye: No discharge.  Cardiovascular:     Rate and Rhythm: Normal rate and regular rhythm.     Heart sounds: No murmur heard.   No friction rub. No gallop.  Pulmonary:     Effort: Pulmonary effort is normal.     Breath sounds: Normal breath sounds.  Abdominal:     General: Bowel sounds are normal.     Palpations: Abdomen is soft.     Tenderness: There is no abdominal tenderness.  Skin:    General: Skin is warm and dry.     Capillary Refill: Capillary refill takes less than 2 seconds.     Comments: Patient has a urticarial appearing red rash without any signs of secondary infection on the upper thighs bilaterally, abdomen, impressively on the forearm and upper arms.  Some swelling of the right upper eyelid, bottom lip, and redness, urticarial rash behind the ears.  Patient has no targetoid lesions, no central clearing, no lesions in the oral mucosa.  Neurological:     Mental Status: She is alert and oriented to person, place, and time.  Psychiatric:        Mood and Affect: Mood normal.        Behavior: Behavior normal.    ED Results / Procedures / Treatments   Labs (all labs ordered are listed, but only abnormal results are displayed) Labs Reviewed - No data to display  EKG None  Radiology No results found.  Procedures Procedures   Medications Ordered in ED Medications  predniSONE (DELTASONE) tablet 40 mg (40 mg Oral Given 02/19/21 1056)  hydrOXYzine (ATARAX/VISTARIL) tablet 25 mg (25 mg Oral Given 02/19/21 1056)    ED Course  I have reviewed the triage vital signs and the nursing notes.  Pertinent labs & imaging results that were available during my  care of the patient were reviewed by me and considered in my medical decision making (see chart for details).    MDM Rules/Calculators/A&P                         I discussed this case with my attending physician who cosigned this note including patient's presenting symptoms, physical exam, and planned diagnostics and interventions. Attending physician stated agreement with plan  or made changes to plan which were implemented.   Attending physician assessed patient at bedside.  Patient initially with some soft signs and symptoms of potential anaphylaxis versus regular allergic reaction given her description of some GI symptoms.  However as GI symptoms resolved with Tums, Pepcid, her primary symptom at this time is rash.  No report chest pain, shortness of breath, difficulty breathing, ongoing GI symptoms, sense of impending doom either at time of initial evaluation or now.  She does have some soft tissue swelling minimally of the lower lip, right eyelid, do not appreciate swelling of the ears the patient describes.  Patient does have some signs of significant hives on her arms and legs and trunk.  Patient reports some relief with Benadryl, Pepcid, hydroxyzine, however rash, itching is difficult to bear at this time.  Patient initially hesitant to corticosteroids given extremely elevated blood pressure with previous administration.  We will administer an oral prednisone instead of one-time Decadron shot for slower release of steroids over time.  Patient agrees to this plan.  We will administer 40 mg of prednisone here, as well as 4 additional days, as well as continuation of her current regimen.  Also encouraged Sarna anti-itch lotion for rash.  Patient instructed to watch for worsening symptoms including throat swelling, difficulty breathing, chest pain, heart palpitations, to follow-up with her primary care doctor as needed.  Discharged in stable condition at this time, return precautions are  given. Final Clinical Impression(s) / ED Diagnoses Final diagnoses:  Rash  Hives    Rx / DC Orders ED Discharge Orders          Ordered    predniSONE (DELTASONE) 20 MG tablet  Daily        02/19/21 1048             Montel Clock [place], PA-C 02/19/21 1116    Milagros Loll, MD 02/23/21 [phone]

## 2021-02-19 NOTE — ED Triage Notes (Signed)
Pt states hives worsening since allergic reaction to flu shot 2 days ago.  C/o lip and eye led swelling.  No obvious distress.  Denies and SOB

## 2022-04-13 ENCOUNTER — Encounter (HOSPITAL_BASED_OUTPATIENT_CLINIC_OR_DEPARTMENT_OTHER): Payer: Self-pay

## 2022-04-13 ENCOUNTER — Emergency Department (HOSPITAL_BASED_OUTPATIENT_CLINIC_OR_DEPARTMENT_OTHER)
Admission: EM | Admit: 2022-04-13 | Discharge: 2022-04-13 | Disposition: A | Discharge status: Home / Self Care | Payer: Medicare Other | Attending: Emergency Medicine | Admitting: Emergency Medicine

## 2022-04-13 ENCOUNTER — Other Ambulatory Visit: Payer: Self-pay

## 2022-04-13 ENCOUNTER — Emergency Department (HOSPITAL_BASED_OUTPATIENT_CLINIC_OR_DEPARTMENT_OTHER): Payer: Medicare Other

## 2022-04-13 DIAGNOSIS — R2241 Localized swelling, mass and lump, right lower limb: Secondary | ICD-10-CM | POA: Diagnosis present

## 2022-04-13 DIAGNOSIS — M79672 Pain in left foot: Secondary | ICD-10-CM | POA: Insufficient documentation

## 2022-04-13 DIAGNOSIS — L729 Follicular cyst of the skin and subcutaneous tissue, unspecified: Secondary | ICD-10-CM | POA: Diagnosis not present

## 2022-04-13 DIAGNOSIS — L089 Local infection of the skin and subcutaneous tissue, unspecified: Secondary | ICD-10-CM | POA: Diagnosis not present

## 2022-04-13 MED ORDER — LIDOCAINE-EPINEPHRINE (PF) 2 %-1:200000 IJ SOLN
10.0000 mL | Freq: Once | INTRAMUSCULAR | Status: AC
  Administered 2022-04-13: 10 mL
  Filled 2022-04-13: qty 20

## 2022-04-13 MED ORDER — ACETAMINOPHEN 325 MG PO TABS
650.0000 mg | ORAL_TABLET | Freq: Once | ORAL | Status: AC
  Administered 2022-04-13: 650 mg via ORAL
  Filled 2022-04-13: qty 2

## 2022-04-13 MED ORDER — LIDOCAINE-EPINEPHRINE-TETRACAINE (LET) TOPICAL GEL
3.0000 mL | Freq: Once | TOPICAL | Status: AC
  Administered 2022-04-13: 3 mL via TOPICAL
  Filled 2022-04-13: qty 3

## 2022-04-13 MED ORDER — DOXYCYCLINE HYCLATE 100 MG PO CAPS: 100.0000 mg | ORAL_CAPSULE | Freq: Two times a day (BID) | ORAL | 0 refills | Status: AC

## 2022-04-13 NOTE — ED Triage Notes (Signed)
States had a "fatty deposit" on posteior/side of right leg for years, recently has started becoming more inflamed, red and painful. Denies fever.

## 2022-04-13 NOTE — Discharge Instructions (Addendum)
Please follow-up with your primary care doctor, today your x-ray does not show any fractures, please make sure you are wearing supportive shoes, and buddy taping your fourth and fifth toes of your left foot.  You can follow-up with your primary care doctor for an additional x-ray.  Treat supportively with ice, Tylenol.  If you notice any changes in the color of your foot, loss of sensation in your foot, or your foot is very cold and does not have a pulse please return to the ER.   Today your infected cyst was drained, please make sure you are looking at it daily, and checking to see if there is redness, warmth, or increased swelling to the area.  The cyst will fill up again, but if you have any kind of redness with swelling please return to the ER or follow-up with your primary care doctor.  Please take the doxycycline with food, to reduce stomach discomfort, and follow-up with your primary care doctor. For definitive removal of the cyst you will need to see a dermatologist.

## 2022-04-13 NOTE — ED Provider Notes (Signed)
[facility] EMERGENCY DEPARTMENT Provider Note   CSN: [id number] Arrival date & time: 04/13/22  0831     History  Chief Complaint  Patient presents with   Abscess    Heather Pitts is a 88 y.o. female, no pertinent past medical hx, who presents to the ED secondary to left foot pain, and then a lesion on her right posterior thigh that has been tender.  She states that around 3-4 days ago she was walking with her walker, and she actually rammed her foot into her walker, and has had left toe pain specifically her little toe, since then.  She states it is bruised, and it hurts to walk.  Has been using Tylenol for pain control.  She rates her pain a 9 out of 10.  She is not on any blood thinners.  Denies any other trauma.  Is able to bear weight, but just states it is painful.  Denies blood thinner use.  Additionally she states that she has a lump on her right posterior thigh that has been there for a long time, but recently has become more red and irritated and painful.  She denies any drainage of it, but states is extremely tender to the touch.  She has not had any fevers or chills.  Is not diabetic.     Home Medications Prior to Admission medications   Medication Sig Start Date End Date Taking? Authorizing Provider  doxycycline (VIBRAMYCIN) 100 MG capsule Take 1 capsule (100 mg total) by mouth 2 (two) times daily. 04/13/22  Yes Cashtyn Pouliot L, PA  cholecalciferol (VITAMIN D) 1000 UNITS tablet Take 2,000 Units by mouth daily.    [provider]  fish oil-omega-3 fatty acids 1000 MG capsule Take 2 g by mouth daily.    [provider]  Flaxseed, Linseed, (FLAXSEED OIL PO) Take 1,000 mg by mouth.    [provider]  glucosamine-chondroitin 500-400 MG tablet Take 1 tablet by mouth 3 (three) times daily.    [provider]  Chilton Si Tea 315 MG CAPS Take by mouth 2 (two) times daily.    [provider]  hydrochlorothiazide (HYDRODIURIL) 25 MG  tablet Take 25 mg by mouth daily.    [provider]  meloxicam (MOBIC) 15 MG tablet Take 15 mg by mouth daily.    [provider]  metoprolol succinate (TOPROL-XL) 25 MG 24 hr tablet Take 25 mg by mouth daily.    [provider]  Multiple Vitamin (MULTIVITAMIN) capsule Take 1 capsule by mouth daily.    [provider]  omeprazole (PRILOSEC) 20 MG capsule Take 20 mg by mouth daily.    [provider]  potassium chloride (K-DUR) 10 MEQ tablet Take 10 mEq by mouth daily.    [provider]  Pyridoxine HCl (B-6 PO) Take 300 mg by mouth.    [provider]      Allergies    Bee venom, Meperidine, Morphine, Tramadol, Aspirin, Codeine, Cortisone, Erythromycin, Flexeril [cyclobenzaprine], Influenza virus vaccine, Other, Pneumococcal vaccine, Shellfish allergy, Stevioside, Stinging nettle [nettle (urtica dioica)], and Tape    Review of Systems   Review of Systems  Constitutional:  Negative for fever.  Skin:  Positive for rash.    Physical Exam Updated Vital Signs BP (!) 188/83 (BP Location: Right Arm)   Pulse 75   Temp 98.5 F (36.9 C) (Oral)   Resp 18   Ht 5' 7.5" (1.715 m)   Wt 85.7 kg   SpO2  100%   BMI 29.16 kg/m  Physical Exam Vitals and nursing note reviewed.  Constitutional:      General: She is not in acute distress.    Appearance: She is well-developed.  HENT:     Head: Normocephalic and atraumatic.  Eyes:     Conjunctiva/sclera: Conjunctivae normal.  Cardiovascular:     Rate and Rhythm: Normal rate and regular rhythm.     Heart sounds: No murmur heard. Pulmonary:     Effort: Pulmonary effort is normal. No respiratory distress.     Breath sounds: Normal breath sounds.  Abdominal:     Palpations: Abdomen is soft.     Tenderness: There is no abdominal tenderness.  Musculoskeletal:        General: No swelling.     Cervical back: Neck supple.     Comments: Left foot: TTP of metatarsals 4+5, ttp of PIP of  digits 4+5. No edema noted.   Able to bear weight. Able to plantar flex and dorsiflex ankle. Inversion/eversion intact. Capillary refill <2sec. Dorsalis pedis pulse present. No foot drop noted. Sensation intact. Warm to touch.    Skin:    General: Skin is warm and dry.     Capillary Refill: Capillary refill takes less than 2 seconds.     Comments: +erythematous mildly edematous nodule on R lateral-posterior thigh, ecchymoses of digits 4+5 of L foot  Neurological:     Mental Status: She is alert.  Psychiatric:        Mood and Affect: Mood normal.     ED Results / Procedures / Treatments   Labs (all labs ordered are listed, but only abnormal results are displayed) Labs Reviewed - No data to display  EKG None  Radiology DG Foot Complete Left  Result Date: 04/13/2022 CLINICAL DATA:  Bumped left foot on walker with pain. Fourth and fifth digits. EXAM: LEFT FOOT - COMPLETE 3+ VIEW COMPARISON:  None Available. FINDINGS: Osteopenia. Slight hallux valgus deformity of the first ray with some possible surgical changes along the distal aspect of the first metatarsal. There is flattening of the head of the second metatarsal. Possible remote injury or AVN. Multifocal joint space loss with sclerosis and osteophyte formation. In particular involving the first metatarsophalangeal joint, first interphalangeal joint, distal interphalangeal joint of the second and third digit. Degenerative changes are seen of the midfoot. Tiny plantar and Achilles calcaneal spurs. Diffuse vascular calcifications. No obvious fracture although there is severe osteopenia which limits evaluation for subtle nondisplaced injury. IMPRESSION: Osteopenia with multifocal degenerative changes and other chronic deformity involving the first and second metatarsals. Vascular calcifications. With this level of osteopenia a subtle nondisplaced injury is difficult to exclude and if needed follow up in 7-10 days can be performed to further  delineate Electronically Signed   By: Jill Side M.D.   On: 04/13/2022 10:17    Procedures .Marland KitchenIncision and Drainage  Date/Time: 04/13/2022 10:13 AM  Performed by: Osvaldo Shipper, PA Authorized by: Osvaldo Shipper, PA   Consent:    Consent obtained:  Verbal   Consent given by:  Patient   Risks, benefits, and alternatives were discussed: yes     Risks discussed:  Bleeding, incomplete drainage, pain and infection   Alternatives discussed:  Alternative treatment Universal protocol:    Patient identity confirmed:  Verbally with patient and arm band Location:    Type:  Cyst (infected cyst)   Size:  1.5x1.5cm   Location:  Lower extremity   Lower extremity location:  Leg   Leg location:  R upper leg Pre-procedure details:    Skin preparation:  Povidone-iodine Anesthesia:    Anesthesia method:  Topical application and local infiltration   Topical anesthetic:  LET   Local anesthetic:  Lidocaine 2% WITH epi Procedure type:    Complexity:  Simple Procedure details:    Ultrasound guidance: no     Incision types:  Stab incision   Incision depth:  Dermal   Wound management:  Probed and deloculated   Drainage:  Purulent and bloody   Drainage amount:  Moderate   Wound treatment:  Wound left open   Packing materials:  None Post-procedure details:    Procedure completion:  Tolerated     Medications Ordered in ED Medications  lidocaine-EPINEPHrine-tetracaine (LET) topical gel (3 mLs Topical Given 04/13/22 0929)  acetaminophen (TYLENOL) tablet 650 mg (650 mg Oral Given 04/13/22 0929)  lidocaine-EPINEPHrine (XYLOCAINE W/EPI) 2 %-1:200000 (PF) injection 10 mL (10 mLs Infiltration Given by Other 04/13/22 4287)    ED Course/ Medical Decision Making/ A&P                           Medical Decision Making Patient is an 88 year old female, who presents to the ED secondary to left foot pain after smashing her foot against her walker, she states is very painful to walk, and primarily affects her  fourth and fifth digits.  We will obtain x-ray for further evaluation give her Tylenol for pain control.  Additionally also complains of a red swollen mass on her right thigh.  It is erythematous and edematous and I am suspicious for an infected cyst, we discussed antibiotics, versus I&D and antibiotics, she would like I&D for relief of the pressure, and we discussed risk and benefits associated with this, she voiced understanding and we proceeded.  Amount and/or Complexity of Data Reviewed Radiology: ordered.    Details: No signs of nondisplaced fracture but recommend 7 to 10-day follow-up for repeat x-ray Discussion of management or test interpretation with external provider(s): Discussed with patient, x-ray unclear at this time no signs of nondisplaced fracture, but recommend 7 to 10-day follow-up for repeat x-ray given osteopenia.  We buddy taped foot, and recommended supportive shoes.  Follow-up with primary care doctor for repeat x-ray.  Additionally tolerated I&D well, moderate amount of purulent and cystic like material that was expressed.  Fluctuance resolved.  Discussed with patient will place on doxycycline, and follow-up with PCP/dermatologist for definitive removal of cyst.  Discussed return precautions, and patient voiced understanding.  Risk OTC drugs. Prescription drug management.   Final Clinical Impression(s) / ED Diagnoses Final diagnoses:  Infected cyst of skin  Left foot pain    Rx / DC Orders ED Discharge Orders          Ordered    doxycycline (VIBRAMYCIN) 100 MG capsule  2 times daily        04/13/22 1022              Evelynne Spiers, [place], [place] 04/13/22 1037    Margette Fast, MD 04/14/22 1651

## 2023-09-20 ENCOUNTER — Emergency Department (HOSPITAL_BASED_OUTPATIENT_CLINIC_OR_DEPARTMENT_OTHER)

## 2023-09-20 ENCOUNTER — Other Ambulatory Visit: Payer: Self-pay

## 2023-09-20 ENCOUNTER — Encounter (HOSPITAL_BASED_OUTPATIENT_CLINIC_OR_DEPARTMENT_OTHER): Payer: Self-pay

## 2023-09-20 ENCOUNTER — Emergency Department (HOSPITAL_BASED_OUTPATIENT_CLINIC_OR_DEPARTMENT_OTHER)
Admission: EM | Admit: 2023-09-20 | Discharge: 2023-09-20 | Disposition: A | Discharge status: Home / Self Care | Attending: Emergency Medicine | Admitting: Emergency Medicine

## 2023-09-20 DIAGNOSIS — R531 Weakness: Secondary | ICD-10-CM | POA: Insufficient documentation

## 2023-09-20 DIAGNOSIS — Z79899 Other long term (current) drug therapy: Secondary | ICD-10-CM | POA: Insufficient documentation

## 2023-09-20 DIAGNOSIS — H539 Unspecified visual disturbance: Secondary | ICD-10-CM

## 2023-09-20 DIAGNOSIS — H53129 Transient visual loss, unspecified eye: Secondary | ICD-10-CM | POA: Diagnosis not present

## 2023-09-20 DIAGNOSIS — R5383 Other fatigue: Secondary | ICD-10-CM | POA: Insufficient documentation

## 2023-09-20 DIAGNOSIS — I10 Essential (primary) hypertension: Secondary | ICD-10-CM | POA: Insufficient documentation

## 2023-09-20 DIAGNOSIS — H538 Other visual disturbances: Secondary | ICD-10-CM | POA: Diagnosis present

## 2023-09-20 LAB — URINALYSIS, W/ REFLEX TO CULTURE (INFECTION SUSPECTED)
Bilirubin Urine: NEGATIVE
Glucose, UA: NEGATIVE mg/dL
Hgb urine dipstick: NEGATIVE
Ketones, ur: NEGATIVE mg/dL
Nitrite: NEGATIVE
Protein, ur: NEGATIVE mg/dL
Specific Gravity, Urine: 1.01 (ref 1.005–1.030)
pH: 6 (ref 5.0–8.0)

## 2023-09-20 LAB — CBC WITH DIFFERENTIAL/PLATELET
Abs Immature Granulocytes: 0.01 10*3/uL (ref 0.00–0.07)
Basophils Absolute: 0 10*3/uL (ref 0.0–0.1)
Basophils Relative: 1 %
Eosinophils Absolute: 0 10*3/uL (ref 0.0–0.5)
Eosinophils Relative: 2 %
HCT: 36.9 % (ref 36.0–46.0)
Hemoglobin: 12.5 g/dL (ref 12.0–15.0)
Immature Granulocytes: 0 %
Lymphocytes Relative: 28 %
Lymphs Abs: 0.8 10*3/uL (ref 0.7–4.0)
MCH: 32.1 pg (ref 26.0–34.0)
MCHC: 33.9 g/dL (ref 30.0–36.0)
MCV: 94.9 fL (ref 80.0–100.0)
Monocytes Absolute: 0.4 10*3/uL (ref 0.1–1.0)
Monocytes Relative: 13 %
Neutro Abs: 1.5 10*3/uL — ABNORMAL LOW (ref 1.7–7.7)
Neutrophils Relative %: 56 %
Platelets: 160 10*3/uL (ref 150–400)
RBC: 3.89 MIL/uL (ref 3.87–5.11)
RDW: 13.5 % (ref 11.5–15.5)
WBC: 2.7 10*3/uL — ABNORMAL LOW (ref 4.0–10.5)
nRBC: 0 % (ref 0.0–0.2)

## 2023-09-20 LAB — COMPREHENSIVE METABOLIC PANEL WITH GFR
ALT: 11 U/L (ref 0–44)
AST: 15 U/L (ref 15–41)
Albumin: 4 g/dL (ref 3.5–5.0)
Alkaline Phosphatase: 56 U/L (ref 38–126)
Anion gap: 9 (ref 5–15)
BUN: 20 mg/dL (ref 8–23)
CO2: 26 mmol/L (ref 22–32)
Calcium: 8.9 mg/dL (ref 8.9–10.3)
Chloride: 99 mmol/L (ref 98–111)
Creatinine, Ser: 0.84 mg/dL (ref 0.44–1.00)
GFR, Estimated: >60 mL/min (ref >60)
Glucose, Bld: 112 mg/dL — ABNORMAL HIGH (ref 70–99)
Potassium: 4.5 mmol/L (ref 3.5–5.1)
Sodium: 133 mmol/L — ABNORMAL LOW (ref 135–145)
Total Bilirubin: 0.6 mg/dL (ref 0.0–1.2)
Total Protein: 6.2 g/dL — ABNORMAL LOW (ref 6.5–8.1)

## 2023-09-20 MED ORDER — LORAZEPAM 1 MG PO TABS: 1.0000 mg | ORAL_TABLET | Freq: Once | ORAL | Status: DC

## 2023-09-20 MED ORDER — LORAZEPAM 2 MG/ML IJ SOLN
0.5000 mg | Freq: Once | INTRAMUSCULAR | Status: AC
  Administered 2023-09-20: 0.5 mg via INTRAVENOUS
  Filled 2023-09-20: qty 1

## 2023-09-20 NOTE — Discharge Instructions (Addendum)
 We evaluated you for your fatigue, episode of visual disturbance, and high blood pressure.  Your testing in the emergency department was reassuring.  Your laboratory testing did not show any significant abnormalities like dehydration or anemia.  Given your episode of abnormal vision, we obtained an MRI which did not show signs of any stroke or dangerous problem.  While we do not know the exact cause of your symptoms, we believe that it is safe to go home at this time.  Please follow-up closely with your primary physician.  Please return if you develop any new or worsening symptoms such as headaches, or loss of vision, chest pain, shortness of breath, leg swelling, abdominal pain, nausea or vomiting, fevers, cough, or any other concerning symptoms.

## 2023-09-20 NOTE — ED Notes (Signed)
 Patient transported to MRI

## 2023-09-20 NOTE — ED Provider Notes (Addendum)
 [place] EMERGENCY DEPARTMENT AT [facility] Provider Note  CSN: [id number] Arrival date & time: 09/20/23 1038  Chief Complaint(s) No chief complaint on file.  HPI Heather Pitts is a 89 y.o. female history of hypertension presenting to the emergency department with visual disturbance.  Patient reports that a few days ago, Saturday, patient has been having some vague vision issues.  Initially started with some trouble with comprehension while reading.  Also felt off, family felt she was dehydrated and she has been drinking lots of fluid.  She has had some persistent mild fatigue and generalized weakness.  No headaches.  No double vision or blurry vision. No eye pain or loss of vision. Has not really read anything since initial episode.  No weakness in the arms or legs, dizziness, clumsiness, trouble breathing, trouble swallowing.  No chest pain or back pain.  No abdominal pain.  Family has noted that she has been having high blood pressures at home.    Past Medical History Past Medical History:  Diagnosis Date   Hypertension    Irregular heart beat    Kidney stone    Reflux    There are no active problems to display for this patient.  Home Medication(s) Prior to Admission medications   Medication Sig Start Date End Date Taking? Authorizing Provider  cholecalciferol (VITAMIN D) 1000 UNITS tablet Take 2,000 Units by mouth daily.    [provider]  doxycycline  (VIBRAMYCIN ) 100 MG capsule Take 1 capsule (100 mg total) by mouth 2 (two) times daily. 04/13/22   Small, Brooke L, PA  fish oil-omega-3 fatty acids 1000 MG capsule Take 2 g by mouth daily.    [provider]  Flaxseed, Linseed, (FLAXSEED OIL PO) Take 1,000 mg by mouth.    [provider]  glucosamine-chondroitin 500-400 MG tablet Take 1 tablet by mouth 3 (three) times daily.    [provider]  Marrie Sizer Tea 315 MG CAPS Take by mouth 2 (two) times daily.    [provider]   hydrochlorothiazide (HYDRODIURIL) 25 MG tablet Take 25 mg by mouth daily.    [provider]  meloxicam (MOBIC) 15 MG tablet Take 15 mg by mouth daily.    [provider]  metoprolol succinate (TOPROL-XL) 25 MG 24 hr tablet Take 25 mg by mouth daily.    [provider]  Multiple Vitamin (MULTIVITAMIN) capsule Take 1 capsule by mouth daily.    [provider]  omeprazole (PRILOSEC) 20 MG capsule Take 20 mg by mouth daily.    [provider]  potassium chloride (K-DUR) 10 MEQ tablet Take 10 mEq by mouth daily.    [provider]  Pyridoxine HCl (B-6 PO) Take 300 mg by mouth.    [provider]  Past Surgical History Past Surgical History:  Procedure Laterality Date   BACK SURGERY     cataracts     FOOT SURGERY     bilateral feet , 3 surgeries each   HAND SURGERY     left   JOINT REPLACEMENT     KNEE ARTHROSCOPY     REVERSE SHOULDER ARTHROPLASTY Right 07/2020   ROTATOR CUFF REPAIR     left   Family History History reviewed. No pertinent family history.  Social History Social History   Tobacco Use   Smoking status: Never   Smokeless tobacco: Never  Substance Use Topics   Alcohol use: No   Drug use: No   Allergies Bee venom, Meperidine, Morphine, Tramadol, Aspirin, Codeine, Cortisone, Erythromycin, Flexeril [cyclobenzaprine], Influenza virus vaccine, Other, Pneumococcal vaccine, Shellfish allergy, Stevioside, Stinging nettle [nettle (urtica dioica)], and Tape  Review of Systems Review of Systems  All other systems reviewed and are negative.   Physical Exam Vital Signs  I have reviewed the triage vital signs BP (!) 188/62   Pulse 65   Temp 98.2 F (36.8 C)   Resp (!) 22   SpO2 99%  Physical Exam Vitals and nursing note reviewed.  Constitutional:      General: She is not in  acute distress.    Appearance: She is well-developed.  HENT:     Head: Normocephalic and atraumatic.     Mouth/Throat:     Mouth: Mucous membranes are moist.  Eyes:     Pupils: Pupils are equal, round, and reactive to light.  Cardiovascular:     Rate and Rhythm: Normal rate and regular rhythm.     Heart sounds: No murmur heard. Pulmonary:     Effort: Pulmonary effort is normal. No respiratory distress.     Breath sounds: Normal breath sounds.  Abdominal:     General: Abdomen is flat.     Palpations: Abdomen is soft.     Tenderness: There is no abdominal tenderness.  Musculoskeletal:        General: No tenderness.     Right lower leg: No edema.     Left lower leg: No edema.  Skin:    General: Skin is warm and dry.  Neurological:     General: No focal deficit present.     Mental Status: She is alert. Mental status is at baseline.     Comments: Cranial nerves II through XII intact, strength 5 out of 5 in the bilateral upper and lower extremities, no sensory deficit to light touch, no dysmetria on finger-nose-finger testing, ambulatory with steady gait. No visual field deficit   Psychiatric:        Mood and Affect: Mood normal.        Behavior: Behavior normal.     ED Results and Treatments Labs (all labs ordered are listed, but only abnormal results are displayed) Labs Reviewed  COMPREHENSIVE METABOLIC PANEL WITH GFR - Abnormal; Notable for the following components:      Result Value   Sodium 133 (*)    Glucose, Bld 112 (*)    Total Protein 6.2 (*)    All other components within normal limits  CBC WITH DIFFERENTIAL/PLATELET - Abnormal; Notable for the following components:   WBC 2.7 (*)    Neutro Abs 1.5 (*)    All other components within normal limits  URINALYSIS, W/ REFLEX TO CULTURE (INFECTION SUSPECTED) - Abnormal; Notable for the following components:   Leukocytes,Ua TRACE (*)    Bacteria, UA  RARE (*)    All other components within normal limits                                                                                                                           Radiology MR BRAIN WO CONTRAST Result Date: 09/20/2023 CLINICAL DATA:  89 year old female with confusion, abnormal speech/language. EXAM: MRI HEAD WITHOUT CONTRAST TECHNIQUE: Multiplanar, multiecho pulse sequences of the brain and surrounding structures were obtained without intravenous contrast. COMPARISON:  Head CT 02/04/2018. FINDINGS: Brain: No restricted diffusion to suggest acute infarction. No midline shift, mass effect, evidence of mass lesion, ventriculomegaly, extra-axial collection or acute intracranial hemorrhage. Cervicomedullary junction and pituitary are within normal limits. Patchy and scattered bilateral cerebral white matter T2 and FLAIR hyperintensity, generally mild for age. No chronic cerebral blood products or cortical encephalomalacia identified. Deep gray nuclei, brainstem and cerebellum are within normal limits for age. Vascular: Major intracranial vascular flow voids are preserved. Skull and upper cervical spine: Negative visible cervical spine for age. Visualized bone marrow signal is within normal limits. Sinuses/Orbits: Chronic postoperative changes to both globes. Paranasal sinuses and mastoids are clear. Other: Visible internal auditory structures appear normal. Negative visible scalp and face. IMPRESSION: 1. No acute intracranial abnormality. 2. Mild for age cerebral white matter signal changes most commonly due to chronic small vessel disease. Electronically Signed   By: Marlise Simpers M.D.   On: 09/20/2023 13:00    Pertinent labs & imaging results that were available during my care of the patient were reviewed by me and considered in my medical decision making (see MDM for details).  Medications Ordered in ED Medications  LORazepam (ATIVAN) injection 0.5 mg (0.5 mg Intravenous Given 09/20/23 1200)                                                                                                                                      Procedures Procedures  (including critical care time)  Medical Decision Making / ED Course   MDM:  89 year old presenting to the emergency department with episode of visual disturbance.    Patient well-appearing, physical examination including neurologic exam is reassuring, no visual field deficit.  Vitals with mild hypertension.  Unclear cause of symptoms, suspect most dangerous cause of be type of stroke affecting vision comprehension or reading comprehension.  Will obtain MRI.  Neurologic exam is otherwise reassuring, patient denies loss  of vision or eye pain, lower concern for other processes such as primary orbital issue.  If MRI is reassuring, likely discharge with outpatient follow-up for further blood pressure medication titration.      Additional history obtained: -External records from outside source obtained and reviewed including: Chart review including previous notes, labs, imaging, consultation notes including prior notes    Lab Tests: -I ordered, reviewed, and interpreted labs.   The pertinent results include:   Labs Reviewed  COMPREHENSIVE METABOLIC PANEL WITH GFR - Abnormal; Notable for the following components:      Result Value   Sodium 133 (*)    Glucose, Bld 112 (*)    Total Protein 6.2 (*)    All other components within normal limits  CBC WITH DIFFERENTIAL/PLATELET - Abnormal; Notable for the following components:   WBC 2.7 (*)    Neutro Abs 1.5 (*)    All other components within normal limits  URINALYSIS, W/ REFLEX TO CULTURE (INFECTION SUSPECTED) - Abnormal; Notable for the following components:   Leukocytes,Ua TRACE (*)    Bacteria, UA RARE (*)    All other components within normal limits    Notable for borderline hyponatremia. Nonspecific mild leukopenia without anemia or thrombocytopenia  EKG   EKG Interpretation Date/Time:  Wednesday September 20 2023 10:53:30 EDT Ventricular Rate:  69 PR  Interval:  265 QRS Duration:  124 QT Interval:  395 QTC Calculation: 424 R Axis:   30  Text Interpretation: Sinus rhythm Prolonged PR interval Right bundle branch block Confirmed by Hiawatha Lout ([id number]) on 09/20/2023 12:24:59 PM         Imaging Studies ordered: I ordered imaging studies including MRI brain On my interpretation imaging demonstrates no acute stroke  I independently visualized and interpreted imaging. I agree with the radiologist interpretation   Medicines ordered and prescription drug management: Meds ordered this encounter  Medications   DISCONTD: LORazepam (ATIVAN) tablet 1 mg   LORazepam (ATIVAN) injection 0.5 mg    -I have reviewed the patients home medicines and have made adjustments as needed   Reevaluation: After the interventions noted above, I reevaluated the patient and found that their symptoms have improved  Co morbidities that complicate the patient evaluation  Past Medical History:  Diagnosis Date   Hypertension    Irregular heart beat    Kidney stone    Reflux       Dispostion: Disposition decision including need for hospitalization was considered, and patient discharged from emergency department.    Final Clinical Impression(s) / ED Diagnoses Final diagnoses:  Transient visual disturbance     This chart was dictated using voice recognition software.  Despite best efforts to proofread,  errors can occur which can change the documentation meaning.    Mordecai Applebaum, MD 09/20/23 1426    Mordecai Applebaum, MD 09/20/23 1426

## 2023-09-20 NOTE — ED Triage Notes (Signed)
 Pt reports that she has been having some difficulties with concentrating on reading, eyes feeling heavy, elevated BP since Saturday. Concerns for dehydration.  No chest pain, SOB or GI symptoms

## 2023-12-26 ENCOUNTER — Emergency Department (HOSPITAL_BASED_OUTPATIENT_CLINIC_OR_DEPARTMENT_OTHER)
Admission: EM | Admit: 2023-12-26 | Discharge: 2023-12-26 | Disposition: A | Discharge status: Home / Self Care | Attending: Emergency Medicine | Admitting: Emergency Medicine

## 2023-12-26 ENCOUNTER — Encounter (HOSPITAL_BASED_OUTPATIENT_CLINIC_OR_DEPARTMENT_OTHER): Payer: Self-pay | Admitting: Emergency Medicine

## 2023-12-26 ENCOUNTER — Other Ambulatory Visit: Payer: Self-pay

## 2023-12-26 DIAGNOSIS — I10 Essential (primary) hypertension: Secondary | ICD-10-CM | POA: Insufficient documentation

## 2023-12-26 MED ORDER — AMLODIPINE BESYLATE 5 MG PO TABS: 5.0000 mg | ORAL_TABLET | Freq: Every day | ORAL | 0 refills | Status: AC

## 2023-12-26 MED ORDER — CLONIDINE HCL 0.1 MG PO TABS
0.1000 mg | ORAL_TABLET | Freq: Once | ORAL | Status: AC
  Administered 2023-12-26: 0.1 mg via ORAL
  Filled 2023-12-26: qty 1

## 2023-12-26 MED ORDER — AMLODIPINE BESYLATE 5 MG PO TABS
5.0000 mg | ORAL_TABLET | Freq: Once | ORAL | Status: AC
Start: 2023-12-26 — End: 2023-12-26
  Administered 2023-12-26: 5 mg via ORAL
  Filled 2023-12-26: qty 1

## 2023-12-26 MED ORDER — CLONIDINE HCL 0.1 MG PO TABS: 0.2000 mg | ORAL_TABLET | Freq: Two times a day (BID) | ORAL | 0 refills | Status: AC | PRN

## 2023-12-26 NOTE — ED Provider Notes (Signed)
 [place] [facility] Provider Note   CSN: [id number] Arrival date & time: 12/26/23  1743     Patient presents with: Hypertension   Heather Pitts is a 89 y.o. female presented to ED with several concerns.  Supplemental history provided by her daughter.  They report the patient has had episodic issues with feeling her face is flushed, her eyes are tired and heavy, and her blood pressure is quite high when she checks it.  She was taken off of hydrochlorothiazide due to hyponatremia problems in the recent past, few months ago, started on spironolactone 12.5 mg.  She also takes metoprolol 50 mg in the day and 25 mg at night.  She has been on this medication for some time.  She has increased her salt diet at the instruction of her doctor.  She was concerned tonight because her blood pressure was 180 when she was checking it, and she again felt face flushed and eyes heavy.  She did have labs done earlier this week which I reviewed, 4 days ago -sodium was 134.  Her thyroid function was effectively normal.  She very mild hypomagnesemia with magnesium 1.7.  CBC notes chronic leukopenia which she has had for a long time.  She denies any chest pain, any new difficulty breathing.  She reports she has some chronic mild edema in her lower extremities.  {Add pertinent medical, surgical, social history, OB history to HPI:32947} HPI     Prior to Admission medications   Medication Sig Start Date End Date Taking? Authorizing Provider  cholecalciferol (VITAMIN D) 1000 UNITS tablet Take 2,000 Units by mouth daily.    [provider]  doxycycline  (VIBRAMYCIN ) 100 MG capsule Take 1 capsule (100 mg total) by mouth 2 (two) times daily. 04/13/22   Small, Brooke L, PA  fish oil-omega-3 fatty acids 1000 MG capsule Take 2 g by mouth daily.    [provider]  Flaxseed, Linseed, (FLAXSEED OIL PO) Take 1,000 mg by mouth.    [provider]   glucosamine-chondroitin 500-400 MG tablet Take 1 tablet by mouth 3 (three) times daily.    [provider]  Landy Tea 315 MG CAPS Take by mouth 2 (two) times daily.    [provider]  hydrochlorothiazide (HYDRODIURIL) 25 MG tablet Take 25 mg by mouth daily.    [provider]  meloxicam (MOBIC) 15 MG tablet Take 15 mg by mouth daily.    [provider]  metoprolol succinate (TOPROL-XL) 25 MG 24 hr tablet Take 25 mg by mouth daily.    [provider]  Multiple Vitamin (MULTIVITAMIN) capsule Take 1 capsule by mouth daily.    [provider]  omeprazole (PRILOSEC) 20 MG capsule Take 20 mg by mouth daily.    [provider]  potassium chloride (K-DUR) 10 MEQ tablet Take 10 mEq by mouth daily.    [provider]  Pyridoxine HCl (B-6 PO) Take 300 mg by mouth.    [provider]    Allergies: Bee venom, Meperidine, Morphine, Tramadol, Aspirin, Codeine, Cortisone, Erythromycin, Flexeril [cyclobenzaprine], Influenza virus vaccine, Other, Pneumococcal vaccine, Shellfish allergy, Stevioside, Stinging nettle [nettle (urtica dioica)], and Tape    Review of Systems  Updated Vital Signs BP (!) 222/82 (BP Location: Right Arm)   Pulse 77   Temp 98.5 F (36.9 C) (Oral)   Resp 20   Ht 5' 7 (1.702 m)   Wt 83.9 kg   SpO2 98%  BMI 28.98 kg/m   Physical Exam Constitutional:      General: She is not in acute distress. HENT:     Head: Normocephalic and atraumatic.  Eyes:     Conjunctiva/sclera: Conjunctivae normal.     Pupils: Pupils are equal, round, and reactive to light.  Cardiovascular:     Rate and Rhythm: Normal rate and regular rhythm.  Pulmonary:     Effort: Pulmonary effort is normal. No respiratory distress.  Abdominal:     General: There is no distension.     Tenderness: There is no abdominal tenderness.  Skin:    General: Skin is warm and dry.  Neurological:     General: No focal deficit present.      Mental Status: She is alert. Mental status is at baseline.  Psychiatric:        Mood and Affect: Mood normal.        Behavior: Behavior normal.     (all labs ordered are listed, but only abnormal results are displayed) Labs Reviewed - No data to display  EKG: EKG Interpretation Date/Time:  Tuesday December 26 2023 18:14:36 EDT Ventricular Rate:  78 PR Interval:  230 QRS Duration:  126 QT Interval:  386 QTC Calculation: 440 R Axis:   25  Text Interpretation: Sinus rhythm Atrial premature complexes Prolonged PR interval Confirmed by Cottie Cough [phone]) on 12/26/2023 6:16:20 PM  Radiology: No results found.  {Document cardiac monitor, telemetry assessment procedure when appropriate:32947} Procedures   Medications Ordered in the ED  cloNIDine  (CATAPRES ) tablet 0.1 mg (has no administration in time range)      {Click here for ABCD2, HEART and other calculators REFRESH Note before signing:1}                              Medical Decision Making Risk Prescription drug management.   Well-appearing patient presented ED with transient episodes of feeling shakiness, palpitations, facial flushing, tired eyes.  No clear etiology on an emergency basis for the symptoms.  Specifically, with normal thyroid functions and no significant electrolyte derangement, do not think these are the cause.  Low suspicion for sepsis.  Doubt ACS.  Her EKG per my review here does not show any acute ischemic findings and is a sinus rhythm.  Do not see evidence of arrhythmia or atrial fibrillation.  I do not see an indication to repeat blood testing and she had this done just 4 days ago.  We can try low-dose clonidine  as a rescue medication.  I would also consider starting her on amlodipine  5 mg as a daily maintenance medication that should not have any significant effects on her electrolytes.  I suspect she probably does need to be on better blood pressure control, particularly given her instructions to  increase her salt intake at home.  I did review her external records including her PCP notes  {Document critical care time when appropriate  Document review of labs and clinical decision tools ie CHADS2VASC2, etc  Document your independent review of radiology images and any outside records  Document your discussion with family members, caretakers and with consultants  Document social determinants of health affecting pt's care  Document your decision making why or why not admission, treatments were needed:32947:::1}   Final diagnoses:  None    ED Discharge Orders     None

## 2023-12-26 NOTE — Telephone Encounter (Signed)
Spoke to patient, questions answered

## 2023-12-26 NOTE — Telephone Encounter (Signed)
 Spoke to [place] and she said that pt and pt's daughter wanted to speak to Dr Alray about pt's BP. I informed [place] that Dr Alray is currently not available since it's the end of the day. As Dr Alray was exiting the building, she was informed about the situation. She agreed to respond tomorrow.

## 2023-12-26 NOTE — Telephone Encounter (Signed)
 Copied from CRM #[id number]. Topic: Clinical Concerns - Medical Question >> Dec 26, 2023  8:37 AM [place] HERO wrote: Pedone, Jozlyn M Heather Pitts is calling other request    Include all details related to the request(s) below:  In Dr. Saralyn note yesterday, patient is suppose to start 200 mg of Magnesium Glycinate nightly. Patient has this at home and has questions and would like to speak with a nurse as to how many she is actually to take. Please call her back today.    Confirm and type the Best Contact Number below:  Patient/caller contact number:         [phone]    [] Home  [] Mobile  [] Work [] Other   [] Okay to leave a voicemail   Medication List:  Current Outpatient Medications:  .  biotin 2,500 mcg capsule, Take 1 capsule by mouth Once Daily., Disp: , Rfl:  .  busPIRone (BUSPAR) 5 mg tablet, TAKE 1/2 TO 1 FULL TAB DAILY AS NEEDED FOR ANXIETY, Disp: 90 tablet, Rfl: 1 .  garlic (Odorless Garlic) 300 mg cap, Take 1,000 mg by mouth 2 (two) times a day., Disp: , Rfl:  .  glucosa su 2KCl/chondroitin su (GLUCOSAMINE-CHONDROITIN DS ORAL), Take 1 tablet by mouth 2 (two) times a day. 1500/1200, Disp: , Rfl:  .  meloxicam (MOBIC) 15 mg tablet, TAKE 1 TABLET BY MOUTH DAILY, Disp: 90 tablet, Rfl: 1 .  metoprolol tartrate (LOPRESSOR) 25 mg tablet, TAKE 2 TABLETS BY MOUTH IN THE  MORNING AND 1 TABLET AT BEDTIME, Disp: 100 tablet, Rfl: 1 .  multivitamin cap, Take 1 capsule by mouth Once Daily., Disp: , Rfl:  .  omega-3 acid ethyl esters (LOVAZA) 1 gram capsule, Take 1 g by mouth 2 (two) times a day., Disp: , Rfl:  .  omeprazole (PriLOSEC) 20 mg DR capsule, TAKE 1 CAPSULE BY MOUTH DAILY, Disp: 100 capsule, Rfl: 2 .  potassium chloride (KLOR-CON) 10 mEq ER tablet, TAKE 1 TABLET BY MOUTH TWICE  DAILY, Disp: 200 tablet, Rfl: 2 .  spironolactone (ALDACTONE) 25 mg tablet, Take 0.5 tablets (12.5 mg total) by mouth daily., Disp: 45 tablet, Rfl: 0 .  vit C-E-zinc ox-copp-lut-zeax (ICaps AREDS2) 250 mg-200 unit  -12.5 mg-1 mg cap, Take  by mouth., Disp: , Rfl:      Medication Request/Refills: Pharmacy Information (if applicable)   [] Not Applicable       []  Pharmacy listed  Send Medication Request to:                                                 [] Pharmacy not listed (added to pharmacy list in Epic) Send Medication Request to:      Listed Pharmacies: [facility]) - [place], [place] - [address] [place] - PHONE: [phone] - FAX: [phone] [facility] Delivery - [place], [place] - [address] - PHONE: [phone] - FAX: [phone] [facility] DRUG STORE [phone] - [place], [place] - [address] AT [facility] OF [address] - PHONE: [phone] - FAX: [phone]

## 2023-12-26 NOTE — ED Triage Notes (Signed)
 Pt presents with hypertension x 3 mos, pt reports she has seen PCP and they have been attempting to regulate BP meds while keeping sodium WNL

## 2023-12-26 NOTE — Discharge Instructions (Addendum)
 I started you on a new medication as you will take every day for your high blood pressure.  This is called amlodipine .  You will continue your other medications prescribed by your doctor.  Please follow-up with your primary care doctor for your persistent high blood pressure, and also your other symptoms including facial flushing and jitteriness.  Please check your blood pressure twice a day, once in the morning once at night, and keep a daily diary.  You can bring this to the doctor's office the next time you go.  This is helpful to see your average blood pressure.  If you have persistently high blood pressure, I prescribed you a rescue medication called clonidine .  You can only take this as needed if your blood pressure is high.  Specifically, if your systolic blood pressure, or the top number, is over 180 mmhg on at least 2 repeat checks, 10 minutes apart, you can take 1 of these tablets.  Do not take more than 1 tablet every 12 hours.  If you are having high blood pressure with a bad headache, blurred vision, stroke symptoms, or chest pressure, please return to the emergency department

## 2023-12-26 NOTE — Telephone Encounter (Addendum)
 Copied from CRM #[id number]. Topic: Clinical Concerns - Emergent Call >> Dec 26, 2023  4:49 PM [place] C wrote: Heather Pitts, Heather Pitts is calling for clinical concerns (Ask: What symptoms are you calling about today, AND how long have you had these symptoms? Must Review HPKW list for symptoms) Document Name of Triage Nurse/BH Rep taking the call when applicable)   Include all details related to the request(s) below: Very High BP readings( 180/86), hot facial temperature, very tired, eyes are very tired. Started out this morning 190/90 and higher, and Provide doesn't know why its happening and neither do the patient. Transfer to triage Nurse [place], Alray had left the building already( patient wanted to talk with her), so I was told to send a message regarding this encounter and they will call the patient tomorrow.    Confirm and type the Best Contact Number below:  Patient/caller contact number:  [phone]             [] Home  [x] Mobile  [] Work  []  Other   []  Okay to leave a voicemail   Medication List:  Current Outpatient Medications:  .  biotin 2,500 mcg capsule, Take 1 capsule by mouth Once Daily., Disp: , Rfl:  .  busPIRone (BUSPAR) 5 mg tablet, TAKE 1/2 TO 1 FULL TAB DAILY AS NEEDED FOR ANXIETY, Disp: 90 tablet, Rfl: 1 .  garlic (Odorless Garlic) 300 mg cap, Take 1,000 mg by mouth 2 (two) times a day., Disp: , Rfl:  .  glucosa su 2KCl/chondroitin su (GLUCOSAMINE-CHONDROITIN DS ORAL), Take 1 tablet by mouth 2 (two) times a day. 1500/1200, Disp: , Rfl:  .  meloxicam (MOBIC) 15 mg tablet, TAKE 1 TABLET BY MOUTH DAILY, Disp: 90 tablet, Rfl: 1 .  metoprolol tartrate (LOPRESSOR) 25 mg tablet, TAKE 2 TABLETS BY MOUTH IN THE  MORNING AND 1 TABLET AT BEDTIME, Disp: 100 tablet, Rfl: 1 .  multivitamin cap, Take 1 capsule by mouth Once Daily., Disp: , Rfl:  .  omega-3 acid ethyl esters (LOVAZA) 1 gram capsule, Take 1 g by mouth 2 (two) times a day., Disp: , Rfl:  .  omeprazole (PriLOSEC)  20 mg DR capsule, TAKE 1 CAPSULE BY MOUTH DAILY, Disp: 100 capsule, Rfl: 2 .  potassium chloride (KLOR-CON) 10 mEq ER tablet, TAKE 1 TABLET BY MOUTH TWICE  DAILY, Disp: 200 tablet, Rfl: 2 .  spironolactone (ALDACTONE) 25 mg tablet, Take 0.5 tablets (12.5 mg total) by mouth daily., Disp: 45 tablet, Rfl: 0 .  vit C-E-zinc ox-copp-lut-zeax (ICaps AREDS2) 250 mg-200 unit -12.5 mg-1 mg cap, Take  by mouth., Disp: , Rfl:      Medication Request/Refills: Pharmacy Information (if applicable)   [x]  Not Applicable       []  Pharmacy listed  Send Medication Request to:                                                 []  Pharmacy not listed (added to pharmacy list in Epic) Send Medication Request to:      Listed Pharmacies: [facility]) - [place], [place] - [address] [place] - PHONE: [phone] - FAX: [phone] [facility] Delivery - [place], [place] - [address] - PHONE: [phone] - FAX: [phone] [facility] DR[facility] STORE [phone] - [place], [place] - [address] AT [facility]  OF [address] - PHONE: [phone] - FAX: [phone]
